# Patient Record
Sex: Male | Born: 1990 | Race: White | Hispanic: No | Marital: Single | Smoking: Never smoker
Health system: Southern US, Community
[De-identification: ages and names within clinical notes are randomized; demographics above are authoritative.]

## PROBLEM LIST (undated history)

## (undated) DIAGNOSIS — R109 Unspecified abdominal pain: Secondary | ICD-10-CM

## (undated) HISTORY — DX: Unspecified abdominal pain: R10.9

---
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
## 2008-12-12 ENCOUNTER — Ambulatory Visit (HOSPITAL_COMMUNITY): Admission: RE | Admit: 2008-12-12 | Discharge: 2008-12-12 | Payer: Self-pay | Admitting: Family Medicine

## 2009-07-28 HISTORY — PX: ARTHROSCOPIC REPAIR ACL: SUR80

## 2009-07-30 ENCOUNTER — Emergency Department (HOSPITAL_COMMUNITY): Admission: EM | Admit: 2009-07-30 | Discharge: 2009-07-30 | Payer: Self-pay | Admitting: Emergency Medicine

## 2010-10-13 LAB — POCT I-STAT, CHEM 8
BUN: 22 mg/dL (ref 6–23)
Chloride: 106 mEq/L (ref 96–112)
HCT: 50 % (ref 39.0–52.0)
Potassium: 4.2 mEq/L (ref 3.5–5.1)
Sodium: 138 mEq/L (ref 135–145)

## 2012-06-04 ENCOUNTER — Emergency Department (HOSPITAL_COMMUNITY)
Admission: EM | Admit: 2012-06-04 | Discharge: 2012-06-05 | Disposition: A | Discharge status: Home / Self Care | Payer: BC Managed Care – PPO | Attending: Emergency Medicine | Admitting: Emergency Medicine

## 2012-06-04 ENCOUNTER — Encounter (HOSPITAL_COMMUNITY): Payer: Self-pay

## 2012-06-04 DIAGNOSIS — W219XXA Striking against or struck by unspecified sports equipment, initial encounter: Secondary | ICD-10-CM | POA: Insufficient documentation

## 2012-06-04 DIAGNOSIS — Y929 Unspecified place or not applicable: Secondary | ICD-10-CM | POA: Insufficient documentation

## 2012-06-04 DIAGNOSIS — S01409A Unspecified open wound of unspecified cheek and temporomandibular area, initial encounter: Secondary | ICD-10-CM | POA: Insufficient documentation

## 2012-06-04 DIAGNOSIS — Y9367 Activity, basketball: Secondary | ICD-10-CM | POA: Insufficient documentation

## 2012-06-04 DIAGNOSIS — IMO0002 Reserved for concepts with insufficient information to code with codable children: Secondary | ICD-10-CM

## 2012-06-04 NOTE — ED Notes (Signed)
"  elbowed" by another player during basketball, small laceration noted to right cheekbone just below eye

## 2012-06-05 MED ORDER — IBUPROFEN 800 MG PO TABS: 800.0000 mg | ORAL_TABLET | Freq: Three times a day (TID) | ORAL | Status: DC

## 2012-06-05 MED ORDER — TETANUS-DIPHTH-ACELL PERTUSSIS 5-2.5-18.5 LF-MCG/0.5 IM SUSP
0.5000 mL | Freq: Once | INTRAMUSCULAR | Status: AC
  Administered 2012-06-05: 0.5 mL via INTRAMUSCULAR
  Filled 2012-06-05: qty 0.5

## 2012-06-05 NOTE — ED Provider Notes (Signed)
History     CSN: [id number]  Arrival date & time 06/04/12  2333   First MD Initiated Contact with Patient 06/04/12 2350      Chief Complaint  Patient presents with  . Facial Laceration    (Consider location/radiation/quality/duration/timing/severity/associated sxs/prior treatment) HPI Hx per PT. Playing basketball and around 10pm caught elbow to R face and sustained lac under R eye, he bandaged it, finished the game and now presents here. No LOC or neck pain, no double vision or change in vision. Bleeding controled PTA. Mild pain, sharp in quality now better. Last tetanus unk. No fall, no other pain/ injury/ trauma.  History reviewed. No pertinent past medical history.  Past Surgical History  Procedure Date  . Arthroscopic repair acl 2011    right knee    History reviewed. No pertinent family history.  History  Substance Use Topics  . Smoking status: Never Smoker   . Smokeless tobacco: Never Used  . Alcohol Use: 0.5 oz/week    1 drink(s) per week      Review of Systems  Constitutional: Negative for fever and fatigue.  HENT: Negative for nosebleeds and neck pain.   Eyes: Negative for photophobia, pain, redness and visual disturbance.  Gastrointestinal: Negative for vomiting.  Musculoskeletal: Negative for back pain.  All other systems reviewed and are negative.    Allergies  Review of patient's allergies indicates no known allergies.  Home Medications  No current outpatient prescriptions on file.  There were no vitals taken for this visit.  Physical Exam  Constitutional: He is oriented to person, place, and time. He appears well-developed and well-nourished.  HENT:  Head: Normocephalic.       2cm lac infraorbital full thickness, curved linear, hemostatic, no underlying bony tenderness, no entrapment with EOMI, vision intact bilat. No epistaxis.   Eyes: EOM are normal. Pupils are equal, round, and reactive to light.  Neck: Neck supple.       No midline  cervical tenderness or deformity  Cardiovascular: Regular rhythm and intact distal pulses.   Pulmonary/Chest: Effort normal. No respiratory distress.  Musculoskeletal: Normal range of motion. He exhibits no edema.  Neurological: He is alert and oriented to person, place, and time.  Skin: Skin is warm and dry.    ED Course  LACERATION REPAIR Date/Time: 06/05/2012 1:02 AM Performed by: Sunnie Nielsen Authorized by: Sunnie Nielsen Consent: Verbal consent obtained. Risks and benefits: risks, benefits and alternatives were discussed Patient understanding: patient states understanding of the procedure being performed Patient consent: the patient's understanding of the procedure matches consent given Procedure consent: procedure consent matches procedure scheduled Required items: required blood products, implants, devices, and special equipment available Patient identity confirmed: verbally with patient Time out: Immediately prior to procedure a "time out" was called to verify the correct patient, procedure, equipment, support staff and site/side marked as required. Body area: head/neck (R infraorbital) Laceration length: 2 cm Tendon involvement: none Nerve involvement: none Vascular damage: no Preparation: Patient was prepped and draped in the usual sterile fashion. Irrigation solution: saline Irrigation method: syringe Amount of cleaning: extensive Skin closure: glue Technique: simple Approximation: close Patient tolerance: Patient tolerated the procedure well with no immediate complications.   (including critical care time)  Tetanus updated. Wound irrigated and wound edges approximated well - decision made to glue with adequate closure achieved.   infx precautions and wound care instructions verbalized as understood   MDM   R infraorbital lac with wound closure, no indication for CT brain with  presentation as above. VS and nursing notes reviewed.         Sunnie Nielsen,  MD 06/05/12 [phone]

## 2015-10-05 ENCOUNTER — Other Ambulatory Visit (HOSPITAL_COMMUNITY): Payer: Self-pay | Admitting: Family Medicine

## 2015-10-05 DIAGNOSIS — R1084 Generalized abdominal pain: Secondary | ICD-10-CM

## 2015-10-10 ENCOUNTER — Ambulatory Visit (HOSPITAL_COMMUNITY)
Admission: RE | Admit: 2015-10-10 | Discharge: 2015-10-10 | Disposition: A | Discharge status: Home / Self Care | Payer: BLUE CROSS/BLUE SHIELD | Source: Ambulatory Visit | Attending: Family Medicine | Admitting: Family Medicine

## 2015-10-10 DIAGNOSIS — R1084 Generalized abdominal pain: Secondary | ICD-10-CM | POA: Diagnosis present

## 2015-10-23 ENCOUNTER — Encounter (INDEPENDENT_AMBULATORY_CARE_PROVIDER_SITE_OTHER): Payer: Self-pay | Admitting: *Deleted

## 2015-11-13 ENCOUNTER — Encounter (INDEPENDENT_AMBULATORY_CARE_PROVIDER_SITE_OTHER): Payer: Self-pay | Admitting: *Deleted

## 2015-11-13 ENCOUNTER — Other Ambulatory Visit (INDEPENDENT_AMBULATORY_CARE_PROVIDER_SITE_OTHER): Payer: Self-pay | Admitting: Internal Medicine

## 2015-11-13 ENCOUNTER — Encounter (INDEPENDENT_AMBULATORY_CARE_PROVIDER_SITE_OTHER): Payer: Self-pay | Admitting: Internal Medicine

## 2015-11-13 ENCOUNTER — Ambulatory Visit (INDEPENDENT_AMBULATORY_CARE_PROVIDER_SITE_OTHER): Payer: BLUE CROSS/BLUE SHIELD | Admitting: Internal Medicine

## 2015-11-13 VITALS — BP 112/60 | HR 72 | Temp 98.0°F | Ht 71.0 in | Wt 188.4 lb

## 2015-11-13 DIAGNOSIS — R1013 Epigastric pain: Secondary | ICD-10-CM

## 2015-11-13 DIAGNOSIS — R1030 Lower abdominal pain, unspecified: Secondary | ICD-10-CM | POA: Diagnosis not present

## 2015-11-13 DIAGNOSIS — R109 Unspecified abdominal pain: Secondary | ICD-10-CM | POA: Insufficient documentation

## 2015-11-13 NOTE — Patient Instructions (Addendum)
EGD. The risks and benefits such as perforation, bleeding, and infection were reviewed with the patient and is agreeable. Samples of Prilosec given to patient.

## 2015-11-13 NOTE — Progress Notes (Signed)
   Subjective:    Patient ID: Glenn Wall, male    DOB: 1991/04/12, 25 y.o.   MRN: [id number]  HPI Referred by Dr Phillips OdorGolding for abdominal pain. He tells me since January, he has had mid abdominal pain. He says everything he eats , he will feel bloated. Sometimes the pain will wake him up from sleep.  The pain occurs at random.  He denies any heart burn.  His appetite is good. No weight loss.  He usually has a BM 1-2 a day. No melena or BRRB.  He is taking a Probiotic  He tried Prilosec which he says ? Helped.   10/10/2015 US abdomen: abdominal pain x 1 month CBD 2.1 mm. . Negative abdominal US.  10/02/2015 H and H 16.0 and 46.5,BUN 22, Creat. 0.84, ALP 56, AST 19, AALT 16.   Review of Systems Past Medical History  Diagnosis Date  . Abdominal pain     Past Surgical History  Procedure Laterality Date  . Arthroscopic repair acl  2011    right knee    No Known Allergies  No current outpatient prescriptions on file prior to visit.   No current facility-administered medications on file prior to visit.        Objective:   Physical Exam Blood pressure 112/60, pulse 72, temperature 98 F (36.7 C), height 5\' 11"  (1.803 m), weight 188 lb 6.4 oz (85.458 kg). Alert and oriented. Skin warm and dry. Oral mucosa is moist.   . Sclera anicteric, conjunctivae is pink. Thyroid not enlarged. No cervical lymphadenopathy. Lungs clear. Heart regular rate and rhythm.  Abdomen is soft. Bowel sounds are positive. No hepatomegaly. No abdominal masses felt. Some epigastric tenderness. No edema to lower extremities.         Assessment & Plan:  Abdominal pain ? Etiology. PUD needs to be ruled out. EGD. The risks and benefits such as perforation, bleeding, and infection were reviewed with the patient and is agreeable. Samples of Prilosec given to patient.

## 2015-11-15 ENCOUNTER — Encounter (INDEPENDENT_AMBULATORY_CARE_PROVIDER_SITE_OTHER): Payer: Self-pay

## 2015-12-05 ENCOUNTER — Ambulatory Visit (HOSPITAL_COMMUNITY)
Admission: RE | Admit: 2015-12-05 | Discharge: 2015-12-05 | Disposition: A | Discharge status: Home / Self Care | Payer: BLUE CROSS/BLUE SHIELD | Source: Ambulatory Visit | Attending: Internal Medicine | Admitting: Internal Medicine

## 2015-12-05 ENCOUNTER — Other Ambulatory Visit (INDEPENDENT_AMBULATORY_CARE_PROVIDER_SITE_OTHER): Payer: Self-pay | Admitting: Internal Medicine

## 2015-12-05 ENCOUNTER — Encounter (HOSPITAL_COMMUNITY): Payer: Self-pay

## 2015-12-05 ENCOUNTER — Encounter (HOSPITAL_COMMUNITY)
Admission: RE | Disposition: A | Discharge status: Home / Self Care | Payer: Self-pay | Source: Ambulatory Visit | Attending: Internal Medicine

## 2015-12-05 DIAGNOSIS — Z79899 Other long term (current) drug therapy: Secondary | ICD-10-CM | POA: Insufficient documentation

## 2015-12-05 DIAGNOSIS — R1033 Periumbilical pain: Secondary | ICD-10-CM | POA: Insufficient documentation

## 2015-12-05 DIAGNOSIS — R1013 Epigastric pain: Secondary | ICD-10-CM

## 2015-12-05 DIAGNOSIS — G8929 Other chronic pain: Secondary | ICD-10-CM

## 2015-12-05 DIAGNOSIS — Z8379 Family history of other diseases of the digestive system: Secondary | ICD-10-CM | POA: Diagnosis not present

## 2015-12-05 DIAGNOSIS — R140 Abdominal distension (gaseous): Secondary | ICD-10-CM | POA: Diagnosis not present

## 2015-12-05 HISTORY — PX: ESOPHAGOGASTRODUODENOSCOPY: SHX5428

## 2015-12-05 LAB — BASIC METABOLIC PANEL
Anion gap: 6 (ref 5–15)
BUN: 21 mg/dL — ABNORMAL HIGH (ref 6–20)
CHLORIDE: 103 mmol/L (ref 101–111)
CO2: 28 mmol/L (ref 22–32)
CREATININE: 0.98 mg/dL (ref 0.61–1.24)
Calcium: 9.1 mg/dL (ref 8.9–10.3)
GFR calc Af Amer: >60 mL/min (ref >60)
GFR calc non Af Amer: >60 mL/min (ref >60)
GLUCOSE: 89 mg/dL (ref 65–99)
POTASSIUM: 3.7 mmol/L (ref 3.5–5.1)
Sodium: 137 mmol/L (ref 135–145)

## 2015-12-05 SURGERY — EGD (ESOPHAGOGASTRODUODENOSCOPY)
Anesthesia: Moderate Sedation

## 2015-12-05 MED ORDER — DICYCLOMINE HCL 10 MG PO CAPS: 10.0000 mg | ORAL_CAPSULE | Freq: Two times a day (BID) | ORAL | Status: DC

## 2015-12-05 MED ORDER — MEPERIDINE HCL 50 MG/ML IJ SOLN
INTRAMUSCULAR | Status: DC | PRN
  Administered 2015-12-05 (×2): 25 mg via INTRAVENOUS

## 2015-12-05 MED ORDER — BUTAMBEN-TETRACAINE-BENZOCAINE 2-2-14 % EX AERO
INHALATION_SPRAY | CUTANEOUS | Status: DC | PRN
  Administered 2015-12-05: 2 via TOPICAL

## 2015-12-05 MED ORDER — MEPERIDINE HCL 50 MG/ML IJ SOLN
INTRAMUSCULAR | Status: AC
  Filled 2015-12-05: qty 1

## 2015-12-05 MED ORDER — SODIUM CHLORIDE 0.9 % IV SOLN
INTRAVENOUS | Status: DC
  Administered 2015-12-05: 07:00:00 via INTRAVENOUS

## 2015-12-05 MED ORDER — MIDAZOLAM HCL 5 MG/5ML IJ SOLN
INTRAMUSCULAR | Status: AC
  Filled 2015-12-05: qty 10

## 2015-12-05 MED ORDER — STERILE WATER FOR IRRIGATION IR SOLN
Status: DC | PRN
  Administered 2015-12-05: 08:00:00

## 2015-12-05 MED ORDER — MIDAZOLAM HCL 5 MG/5ML IJ SOLN
INTRAMUSCULAR | Status: DC | PRN
  Administered 2015-12-05: 3 mg via INTRAVENOUS
  Administered 2015-12-05 (×2): 2 mg via INTRAVENOUS
  Administered 2015-12-05: 3 mg via INTRAVENOUS

## 2015-12-05 NOTE — Discharge Instructions (Signed)
Discontinue omeprazole. Dicyclomine 10 mg by mouth 15-30 minutes before breakfast and evening meal daily. Resume usual diet. No driving for 24 hours. Will schedule abdominopelvic CT. Office will call.  Gastrointestinal Endoscopy, Care After Refer to this sheet in the next few weeks. These instructions provide you with information on caring for yourself after your procedure. Your caregiver may also give you more specific instructions. Your treatment has been planned according to current medical practices, but problems sometimes occur. Call your caregiver if you have any problems or questions after your procedure. HOME CARE INSTRUCTIONS  If you were given medicine to help you relax (sedative), do not drive, operate machinery, or sign important documents for 24 hours.  Avoid alcohol and hot or warm beverages for the first 24 hours after the procedure.  Only take over-the-counter or prescription medicines for pain, discomfort, or fever as directed by your caregiver. You may resume taking your normal medicines unless your caregiver tells you otherwise. Ask your caregiver when you may resume taking medicines that may cause bleeding, such as aspirin, clopidogrel, or warfarin.  You may return to your normal diet and activities on the day after your procedure, or as directed by your caregiver. Walking may help to reduce any bloated feeling in your abdomen.  Drink enough fluids to keep your urine clear or pale yellow.  You may gargle with salt water if you have a sore throat. SEEK IMMEDIATE MEDICAL CARE IF:  You have severe nausea or vomiting.  You have severe abdominal pain, abdominal cramps that last longer than 6 hours, or abdominal swelling (distention).  You have severe shoulder or back pain.  You have trouble swallowing.  You have shortness of breath, your breathing is shallow, or you are breathing faster than normal.  You have a fever or a rapid heartbeat.  You vomit blood or material  that looks like coffee grounds.  You have bloody, black, or tarry stools. MAKE SURE YOU:  Understand these instructions.  Will watch your condition.  Will get help right away if you are not doing well or get worse.   This information is not intended to replace advice given to you by your health care provider. Make sure you discuss any questions you have with your health care provider.   Document Released: 02/26/2004 Document Revised: 08/04/2014 Document Reviewed: 10/14/2011 Elsevier Interactive Patient Education [employer]2016 Elsevier Inc.

## 2015-12-05 NOTE — Op Note (Signed)
[facility] Patient Name: Glenn Wall Procedure Date: 12/05/2015 7:32 AM MRN: [id number] Date of Birth: 11-Jul-1991 Attending MD: Lionel December , MD CSN: [id number] Age: 25 Admit Type: Outpatient Procedure:                Upper GI endoscopy Indications:              Epigastric abdominal pain, Periumbilical abdominal                            pain and postprandial bloating. Providers:                Lionel December, MD, Nena Polio, RN, Calton Dach,                            Technician Referring MD:             Corrie Mckusick Medicines:                Cetacaine spray, Meperidine 50 mg IV, Midazolam 10                            mg IV Complications:            No immediate complications. Estimated Blood Loss:     Estimated blood loss: none. Procedure:                Pre-Anesthesia Assessment:                           - Prior to the procedure, a History and Physical                            was performed, and patient medications and                            allergies were reviewed. The patient's tolerance of                            previous anesthesia was also reviewed. The risks                            and benefits of the procedure and the sedation                            options and risks were discussed with the patient.                            All questions were answered, and informed consent                            was obtained. Prior Anticoagulants: The patient has                            taken no previous anticoagulant or antiplatelet  agents. ASA Grade Assessment: I - A normal, healthy                            patient. After reviewing the risks and benefits,                            the patient was deemed in satisfactory condition to                            undergo the procedure.                           After obtaining informed consent, the endoscope was                            passed under direct vision.  Throughout the                            procedure, the patient's blood pressure, pulse, and                            oxygen saturations were monitored continuously. The                            EG-[id number] ([id number]) scope was introduced through the                            mouth, and advanced to the second part of duodenum.                            The upper GI endoscopy was accomplished without                            difficulty. The patient tolerated the procedure                            well. Scope In: 7:50:29 AM Scope Out: 7:54:55 AM Total Procedure Duration: 0 hours 4 minutes 26 seconds  Findings:      The examined esophagus was normal.      The Z-line was regular and was found 40 cm from the incisors.      The entire examined stomach was normal.      The duodenal bulb and second portion of the duodenum were normal.      The hypopharynx was normal. Impression:               - Normal esophagus.                           - Z-line regular, 40 cm from the incisors.                           - Normal stomach.                           - Normal duodenal bulb  and second portion of the                            duodenum.                           - Normal hypopharynx.                           - No specimens collected. Moderate Sedation:      Moderate (conscious) sedation was administered by the endoscopy nurse       and supervised by the endoscopist. The following parameters were       monitored: oxygen saturation, heart rate, blood pressure, CO2       capnography and response to care. Total physician intraservice time was       12 minutes. Recommendation:           - Patient has a contact number available for                            emergencies. The signs and symptoms of potential                            delayed complications were discussed with the                            patient. Return to normal activities tomorrow.                            Written  discharge instructions were provided to the                            patient.                           - Resume previous diet today.                           - Discontinue PPIs today.                           - Use Bentyl (dicyclomine) 10 mg PO BID 30 min AC                            today.                           - Perform CT scan (computed tomography) of the                            abdomen with contrast at appointment to be                            scheduled.                           - Metabolic 7 to be checked today. Procedure Code(s):        ---  Professional ---                           [id number], Esophagogastroduodenoscopy, flexible,                            transoral; diagnostic, including collection of                            specimen(s) by brushing or washing, when performed                            (separate procedure)                           99152, Moderate sedation services provided by the                            same physician or other qualified health care                            professional performing the diagnostic or                            therapeutic service that the sedation supports,                            requiring the presence of an independent trained                            observer to assist in the monitoring of the                            patient's level of consciousness and physiological                            status; initial 15 minutes of intraservice time,                            patient age 31 years or older Diagnosis Code(s):        --- Professional ---                           R10.13, Epigastric pain                           R10.33, Periumbilical pain CPT copyright 2016 American Medical Association. All rights reserved. The codes documented in this report are preliminary and upon coder review may  be revised to meet current compliance requirements. Lionel December, MD Lionel December, MD 12/05/2015 8:05:55  AM This report has been signed electronically. Number of Addenda: 0

## 2015-12-05 NOTE — H&P (Signed)
Glenn Wall is an 25 y.o. male.   Chief Complaint: Patient is here for EGD. HPI: Patient is 25 year old Caucasian male presents with 3-4 month history of fatty umbilical abdominal pain postprandial bloating he also has had pain wake him at night. He took Prilosec which may have helped. Ultrasound was negative for cholelithiasis. Blood work was normal other than borderline serum calcium and BUN of 22(upper limit of normal 20). He denies diarrhea melena or rectal bleeding. He has good appetite and has not lost any weight. He does not take NSAIDs. Does not smoke cigarettes. He drinks alcohol socially but not every day. He was begun on Prilosec for the time of office visit and he feels it may be helping. Family history significant for peptic ulcer disease in his mother and father has health problems as well. Family history is negative for celiac disease or IBD.  Past Medical History  Diagnosis Date  . Abdominal pain     Past Surgical History  Procedure Laterality Date  . Arthroscopic repair acl  2011    right knee    History reviewed. No pertinent family history. Social History:  reports that he has never smoked. He has never used smokeless tobacco. He reports that he drinks about 0.5 oz of alcohol per week. He reports that he does not use illicit drugs.  Allergies: No Known Allergies  Medications Prior to Admission  Medication Sig Dispense Refill  . omeprazole (PRILOSEC) 10 MG capsule Take 10 mg by mouth daily.      No results found for this or any previous visit (from the past 48 hour(s)). No results found.  ROS  Blood pressure 115/72, pulse 66, temperature 98.6 F (37 C), temperature source Oral, resp. rate 16, height 5\' 11"  (1.803 m), weight 183 lb (83.008 kg), SpO2 96 %. Physical Exam  Constitutional: He appears well-developed and well-nourished.  HENT:  Mouth/Throat: Oropharynx is clear and moist.  Eyes: Conjunctivae are normal. No scleral icterus.  Neck: No thyromegaly  present.  Cardiovascular: Normal rate, regular rhythm and normal heart sounds.   No murmur heard. Respiratory: Effort normal and breath sounds normal.  GI: Soft. He exhibits no distension and no mass. There is no tenderness.  Musculoskeletal: He exhibits no edema.  Lymphadenopathy:    He has no cervical adenopathy.  Neurological: He is alert.  Skin: Skin is warm and dry.     Assessment/Plan Recurrent. Umbilicus pain with postprandial bloating negative ultrasound. Diagnostic EGD.  Malissa HippoEHMAN,Leith Hedlund U, MD 12/05/2015, 7:36 AM

## 2015-12-06 ENCOUNTER — Encounter (HOSPITAL_COMMUNITY): Payer: Self-pay | Admitting: Internal Medicine

## 2015-12-10 ENCOUNTER — Ambulatory Visit (HOSPITAL_COMMUNITY)
Admission: RE | Admit: 2015-12-10 | Discharge: 2015-12-10 | Disposition: A | Discharge status: Home / Self Care | Payer: BLUE CROSS/BLUE SHIELD | Source: Ambulatory Visit | Attending: Internal Medicine | Admitting: Internal Medicine

## 2015-12-10 DIAGNOSIS — R1033 Periumbilical pain: Secondary | ICD-10-CM

## 2015-12-10 DIAGNOSIS — G8929 Other chronic pain: Secondary | ICD-10-CM

## 2015-12-10 DIAGNOSIS — K769 Liver disease, unspecified: Secondary | ICD-10-CM | POA: Diagnosis not present

## 2015-12-10 DIAGNOSIS — R1013 Epigastric pain: Secondary | ICD-10-CM | POA: Diagnosis not present

## 2015-12-10 MED ORDER — IOPAMIDOL (ISOVUE-300) INJECTION 61%
100.0000 mL | Freq: Once | INTRAVENOUS | Status: AC | PRN
  Administered 2015-12-10: 100 mL via INTRAVENOUS

## 2015-12-13 ENCOUNTER — Telehealth (INDEPENDENT_AMBULATORY_CARE_PROVIDER_SITE_OTHER): Payer: Self-pay | Admitting: Internal Medicine

## 2015-12-13 NOTE — Telephone Encounter (Signed)
Patient called, left a message stating that he had a CT done on Monday and would like his results.  [phone]

## 2015-12-13 NOTE — Telephone Encounter (Signed)
Results reviewed with patient

## 2015-12-13 NOTE — Telephone Encounter (Signed)
Forwarded to Dr.Rehman for review. 

## 2015-12-18 ENCOUNTER — Encounter (INDEPENDENT_AMBULATORY_CARE_PROVIDER_SITE_OTHER): Payer: Self-pay | Admitting: *Deleted

## 2016-03-18 ENCOUNTER — Ambulatory Visit (INDEPENDENT_AMBULATORY_CARE_PROVIDER_SITE_OTHER): Payer: BLUE CROSS/BLUE SHIELD | Admitting: Internal Medicine

## 2016-04-02 ENCOUNTER — Ambulatory Visit (INDEPENDENT_AMBULATORY_CARE_PROVIDER_SITE_OTHER): Payer: BLUE CROSS/BLUE SHIELD | Admitting: Sports Medicine

## 2016-04-02 ENCOUNTER — Encounter: Payer: Self-pay | Admitting: Sports Medicine

## 2016-04-02 DIAGNOSIS — M25561 Pain in right knee: Secondary | ICD-10-CM | POA: Diagnosis not present

## 2016-04-02 NOTE — Progress Notes (Signed)
  [name] - 25 y.o. male MRN [id number]  Date of birth: 27-Mar-1991  SUBJECTIVE:  Including CC & ROS.  Chief Complaint  Patient presents with  . Knee Pain     [name] is a 25 year old M presenting with right knee pain. This pain started about 2 months ago. He notices the pain behind the kneecap. It is worse with a squat or any time has 20 of extension. He has no pain while running but notices the pain afterwards. He also has pain at the end of the day. He does not notice any significant swelling. His knee has not done any locking or buckling. He has not taken any medications for this pain has not had any formal physical therapy. He feels the pain going up stairs and is sharp in nature. He has a history of an anterior cruciate ligament repair of the right knee from his hamstring.   HISTORY: Past Medical, Surgical, Social, and Family History Reviewed & Updated per EMR.   Pertinent Historical Findings include: PMSHx -  Right ACL repair at 25 yo  PSHx -  No tobacco use, works as Advertising account plannernsurance agent.  FHx -  Father with history of arthritis   DATA REVIEWED: None to review   PHYSICAL EXAM:  VS: BP:118/78  HR: bpm  TEMP: ( )  RESP:   HT:5\' 11"  (180.3 cm)   WT:180 lb (81.6 kg)  BMI:25.2 PHYSICAL EXAM: Gen: NAD, alert, cooperative with exam, well-appearing HEENT: clear conjunctiva, EOMI CV:  no edema, capillary refill brisk,  Resp: non-labored, normal speech Skin: no rashes, normal turgor  Neuro: no gross deficits.  Psych:  alert and oriented Knee: Visible atrophy of the right VMO compared to the left Palpation normal with no warmth, joint line tenderness, patellar tenderness, or condyle tenderness. ROM full in flexion and extension and lower leg rotation. Ligaments with solid consistent endpoints including ACL, PCL, LCL, MCL. Negative Mcmurray's, Apley's, and Thessalonian tests. Non painful patellar compression. Patellar glide without crepitus. Patellar and quadriceps tendons  unremarkable. Hamstring and quadriceps strength is normal.   Limited ultrasound: Right knee: Mild effusion in the suprapatellar pouch. Quadricep and patellar tendon are normal. Medial and lateral meniscus are intact. Trochlear groove with normal appearance.  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 He has atrophy of the right VMO compared to the left which contributing. - He was provided with strengthening exercises - He will also try a patellar strap versus a body helix patella strap. - He will follow-up in 4 weeks. If there is no improvement may need to pursue formal physical therapy.

## 2016-04-03 DIAGNOSIS — M25561 Pain in right knee: Secondary | ICD-10-CM | POA: Insufficient documentation

## 2016-04-03 NOTE — Assessment & Plan Note (Signed)
Pain is most likely patellofemoral in nature. He has atrophy of the right VMO compared to the left which contributing. - He was provided with strengthening exercises - He will also try a patellar strap versus a body helix patella strap. - He will follow-up in 4 weeks. If there is no improvement may need to pursue formal physical therapy.

## 2016-05-14 ENCOUNTER — Encounter: Payer: Self-pay | Admitting: Sports Medicine

## 2016-05-14 ENCOUNTER — Ambulatory Visit
Admission: RE | Admit: 2016-05-14 | Discharge: 2016-05-14 | Disposition: A | Discharge status: Home / Self Care | Payer: BLUE CROSS/BLUE SHIELD | Source: Ambulatory Visit | Attending: Sports Medicine | Admitting: Sports Medicine

## 2016-05-14 ENCOUNTER — Other Ambulatory Visit: Payer: Self-pay | Admitting: Sports Medicine

## 2016-05-14 ENCOUNTER — Ambulatory Visit (INDEPENDENT_AMBULATORY_CARE_PROVIDER_SITE_OTHER): Payer: BLUE CROSS/BLUE SHIELD | Admitting: Sports Medicine

## 2016-05-14 VITALS — BP 124/83 | HR 72 | Ht 71.0 in | Wt 175.0 lb

## 2016-05-14 DIAGNOSIS — M25561 Pain in right knee: Secondary | ICD-10-CM

## 2016-05-14 NOTE — Assessment & Plan Note (Signed)
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 - Referral to physical therapy - Right knee x-ray, AP, lateral, sunrise - He will follow-up in 6 weeks. If there is no improvement may need to consider an injection.

## 2016-05-14 NOTE — Progress Notes (Signed)
  Paula Libralan J Schreifels - 25 y.o. male MRN [id number]  Date of birth: 1990/12/20  SUBJECTIVE:  Including CC & ROS.  No chief complaint on file.   Mr. Rod CanBrower is a 25 yo M that is following up for right knee pain. It is thought that he had patellofemoral syndrome when he was last seen. He has been doing exercises and has had improvement in his strength was still has pain. He has pain with any form of squatting. He is able to extend his knee completely and the grinding sensation has gotten better. He still has pain with going downstairs. He denies any buckling, giving way, numbness, or tingling.  HISTORY: Past Medical, Surgical, Social, and Family History Reviewed & Updated per EMR.   Pertinent Historical Findings include: PMSHx -  Right ACL repair at 25 yo   DATA REVIEWED: Previous ultrasounds.   PHYSICAL EXAM:  VS: BP:124/83  HR:72bpm  TEMP: ( )  RESP:   HT:5\' 11"  (180.3 cm)   WT:175 lb (79.4 kg)  BMI:24.5 PHYSICAL EXAM: Gen: NAD, alert, cooperative with exam, well-appearing HEENT: clear conjunctiva, EOMI CV:  no edema, capillary refill brisk,  Resp: non-labored, normal speech Skin: no rashes, normal turgor  Neuro: no gross deficits.  Psych:  alert and oriented Right knee:  No obvious effusion. No tenderness to palpation of the medial lateral joint line. Normal clot of patellar tendon. Negative McMurray's test. No pain to stress testing with valgus or varus. No pain with patellar grind. Reproduction of pain when performing a squat.   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 - Referral to physical therapy - Right knee x-ray, AP, lateral, sunrise - He will follow-up in 6 weeks. If there is no improvement may need to consider an injection.

## 2016-05-23 ENCOUNTER — Ambulatory Visit: Payer: BLUE CROSS/BLUE SHIELD | Attending: Sports Medicine | Admitting: Physical Therapy

## 2016-05-23 ENCOUNTER — Encounter: Payer: Self-pay | Admitting: Physical Therapy

## 2016-05-23 DIAGNOSIS — G8929 Other chronic pain: Secondary | ICD-10-CM | POA: Insufficient documentation

## 2016-05-23 DIAGNOSIS — M25561 Pain in right knee: Secondary | ICD-10-CM | POA: Insufficient documentation

## 2016-05-23 NOTE — Therapy (Signed)
[facility] [address] [place], [place], [address] Phone: [phone]   Fax:  [phone]  Physical Therapy Evaluation  Patient Details  Name: Glenn Libralan J Kille MRN: [id number] Date of Birth: 30-Aug-1990 Referring Provider: Enid BaasKarl Fields, MD  Encounter Date: 05/23/2016      PT End of Session - 05/23/16 1205    Visit Number 1   Number of Visits 9   Date for PT Re-Evaluation 06/29/16   Authorization Type BCBS 30 visit limit   PT Start Time 1019   PT Stop Time 1059   PT Time Calculation (min) 40 min   Activity Tolerance Patient tolerated treatment well   Behavior During Therapy [facility] for tasks assessed/performed      Past Medical History:  Diagnosis Date  . Abdominal pain     Past Surgical History:  Procedure Laterality Date  . ARTHROSCOPIC REPAIR ACL  2011   right knee  . ESOPHAGOGASTRODUODENOSCOPY N/A 12/05/2015   Procedure: ESOPHAGOGASTRODUODENOSCOPY (EGD);  Surgeon: Malissa HippoNajeeb U Rehman, MD;  [facility];  Service: Endoscopy;  Laterality: N/A;  2:05 - moved to 5/19 @ 2:40 - Ann notified pt    There were no vitals filed for this visit.       Subjective Assessment - 05/23/16 1021    Subjective 6 years ago ACL repair. approx 4 months ago. Not painful when walking, slightly when running. Has pain with leg extension, squatting is uncomfortable, unable to do single leg squat.    Diagnostic tests xrays unremarkable   Patient Stated Goals return to exercise, decrease pain.    Currently in Pain? No/denies  2/10 at worst            [facility] PT Assessment - 05/23/16 0001      Assessment   Medical Diagnosis R patellofemoral syndrome   Referring Provider Enid BaasKarl Fields, MD   Hand Dominance Right   Next MD Visit nov 29   Prior Therapy no     Precautions   Precautions None     Restrictions   Weight Bearing Restrictions No     Balance Screen   Has the patient fallen in the past 6 months No     Home Environment    Living Environment Private residence   Additional Comments no     Prior Function   Level of Independence Independent   Vocation Full time employment   Vocation Requirements sell insurance     Cognition   Overall Cognitive Status Within Functional Limits for tasks assessed     Observation/Other Assessments   Focus on Therapeutic Outcomes (FOTO)  57% ability     Posture/Postural Control   Posture Comments valgus collapse in single leg stance andsquat.      ROM / Strength   AROM / PROM / Strength Strength     Strength   Strength Assessment Site Hip   Right/Left Hip Right;Left   Right Hip Flexion 4+/5  illiopsoas 4-/5   Right Hip Extension 5/5   Right Hip ABduction 5/5   Left Hip Flexion 4+/5  illiopsoas 4-/5   Left Hip Extension 5/5   Left Hip ABduction 5/5     Palpation   Patella mobility limited sup/inf on R.                   [name] - 05/23/16 0001      Exercises   Exercises Knee/Hip     Knee/Hip Exercises: Stretches   Passive Hamstring Stretch  Limitations seated EOB   Quad Stretch Limitations standing quad/hip flexor stretch   Piriformis Stretch Limitations figure 4     Knee/Hip Exercises: Standing   Functional Squat Limitations cuing for proper alignemnt     Manual Therapy   Manual Therapy Taping   McConnell lateral patellar glide                PT Education - 05/23/16 1205    Education provided Yes   Education Details anatomy of condition, POC, HEP, exercise form/rationale, importance of stretching, LE alignment in squats   Person(s) Educated Patient   Methods Explanation;Demonstration;Tactile cues;Verbal cues;Handout   Comprehension Verbalized understanding;Returned demonstration;Verbal cues required;Tactile cues required;Need further instruction          PT Short Term Goals - 05/23/16 1210      PT SHORT TERM GOAL #1   Title Pt will be able to descend stairs without knee pain by 12/3   Baseline  moderate pain at eval   Time 4   Period Weeks   Status New     PT SHORT TERM GOAL #2   Title Pt will be able to return to independent exercise program without limitation by knee pain   Baseline has decreased exercise due to pain   Time 4   Period Weeks   Status New     PT SHORT TERM GOAL #3   Title FOTO to 76% ability to indicate significant increase in fucntional ability   Baseline 57% ability at eval   Time 4   Period Weeks   Status New     PT SHORT TERM GOAL #4   Title Pt will be able to demonstrate an appropraite squat to and from a chair without knee pain    Baseline moderate pain at eval   Time 4   Period Weeks   Status New                  Plan - 05/23/16 1206    Clinical Impression Statement Pt presents to PT with complaints of R knee pain that bothers him mostly with squatting and descending stairs. H/O ACL repair 6 years ago. Pt has good strength with exception of hip flexion and isolation of illiopsoas which are weak. pt demo IR of R lower extremity in SLS and valgus collapse in squatting, pain significantly decreased with application of taping for lateral patellar glide. Pt will benefit from skilled PT to improve awareness of biomechanical chain contraction to improve patellar tracking and decrease pain.    Rehab Potential Good   PT Frequency 2x / week   PT Duration 4 weeks   PT Treatment/Interventions ADLs/Self Care Home Management;Cryotherapy;Electrical Stimulation;Iontophoresis 4mg /ml Dexamethasone;Functional mobility training;Stair training;Gait training;Ultrasound;Traction;Moist Heat;Therapeutic activities;Therapeutic exercise;Balance training;Neuromuscular re-education;Patient/family education;Passive range of motion;Manual techniques;Dry needling;Taping   PT Next Visit Plan mcconnel tape R lateral patellar glide, stretch, illiopsoas activation   PT Home Exercise Plan hamstring and quad stretch, figure 4 piriformis stretch   Consulted and Agree with  Plan of Care Patient      Patient will benefit from skilled therapeutic intervention in order to improve the following deficits and impairments:  Pain, Improper body mechanics, Impaired flexibility, Decreased strength, Decreased activity tolerance  Visit Diagnosis: Chronic pain of right knee - Plan: PT plan of care cert/re-cert     Problem List Patient Active Problem List   Diagnosis Date Noted  . Right knee pain 04/03/2016  . Abdominal pain 11/13/2015    Ludger Bones C. Onix Jumper PT, [name] 05/23/16  12:14 PM   [facility] [address] [place], [place], [address] Phone: [phone]   Fax:  [phone]  Name: Glenn Wall MRN: [id number] Date of Birth: 1990-10-21

## 2016-05-27 ENCOUNTER — Ambulatory Visit: Payer: BLUE CROSS/BLUE SHIELD | Admitting: Physical Therapy

## 2016-05-27 DIAGNOSIS — M25561 Pain in right knee: Secondary | ICD-10-CM | POA: Diagnosis not present

## 2016-05-27 DIAGNOSIS — G8929 Other chronic pain: Secondary | ICD-10-CM

## 2016-05-27 NOTE — Therapy (Signed)
[facility] [address] [place], [place], [address] Phone: [phone]   Fax:  [phone]  Physical Therapy Treatment  Patient Details  Name: Glenn Wall MRN: [id number] Date of Birth: Jul 04, 1991 Referring Provider: Enid BaasKarl Fields, MD  Encounter Date: 05/27/2016      PT End of Session - 05/27/16 1422    Visit Number 2   Number of Visits 9   Date for PT Re-Evaluation 06/29/16   Authorization Type BCBS 30 visit limit   PT Start Time 0218   PT Stop Time 0308   PT Time Calculation (min) 50 min      Past Medical History:  Diagnosis Date  . Abdominal pain     Past Surgical History:  Procedure Laterality Date  . ARTHROSCOPIC REPAIR ACL  2011   right knee  . ESOPHAGOGASTRODUODENOSCOPY N/A 12/05/2015   Procedure: ESOPHAGOGASTRODUODENOSCOPY (EGD);  Surgeon: Malissa HippoNajeeb U Rehman, MD;  [facility];  Service: Endoscopy;  Laterality: N/A;  2:05 - moved to 5/19 @ 2:40 - Ann notified pt    There were no vitals filed for this visit.                       [name] - 05/27/16 0001      Knee/Hip Exercises: Stretches   Passive Hamstring Stretch Limitations seated EOB   Quad Stretch Limitations standing quad/hip flexor stretch   Piriformis Stretch Limitations figure 4     Knee/Hip Exercises: Standing   Forward Step Up 5 reps   Functional Squat Limitations cuing for proper alignemnt     Knee/Hip Exercises: Supine   Bridges Limitations x 10 with DF, the x 19 single leg   Straight Leg Raise with External Rotation 1 set;15 reps     Modalities   Modalities Cryotherapy     Cryotherapy   Number Minutes Cryotherapy 10 Minutes   Cryotherapy Location Knee   Type of Cryotherapy Ice pack     Manual Therapy   Manual Therapy Taping   McConnell lateral patellar glide                PT Education - 05/27/16 1510    Education provided Yes   Education Details hep, name of  mcconnel tape   Person(s) Educated Patient   Methods Explanation;Handout   Comprehension Verbalized understanding          PT Short Term Goals - 05/23/16 1210      PT SHORT TERM GOAL #1   Title Pt will be able to descend stairs without knee pain by 12/3   Baseline moderate pain at eval   Time 4   Period Weeks   Status New     PT SHORT TERM GOAL #2   Title Pt will be able to return to independent exercise program without limitation by knee pain   Baseline has decreased exercise due to pain   Time 4   Period Weeks   Status New     PT SHORT TERM GOAL #3   Title FOTO to 76% ability to indicate significant increase in fucntional ability   Baseline 57% ability at eval   Time 4   Period Weeks   Status New     PT SHORT TERM GOAL #4   Title Pt will be able to demonstrate an appropraite squat to and from a chair without knee pain    Baseline moderate pain at eval   Time 4  Period Weeks   Status New                  Plan - 05/27/16 1510    Clinical Impression Statement Review of stretches, patient is independent. Added SLR with abduction and external rotation. Pt quicky fatigues. Added to HEP. Pt reports tape lateral pulled tape helpful while donned however he had to remove it for stretches. He may purchas his own tape. Squats with tape donned more comfortable however continued pain with eccentric step with tape donned.    PT Next Visit Plan mcconnel tape R lateral patellar glide, stretch, review illiopsoas activation   PT Home Exercise Plan hamstring and quad stretch, figure 4 piriformis stretch, SLR with ER    Consulted and Agree with Plan of Care Patient      Patient will benefit from skilled therapeutic intervention in order to improve the following deficits and impairments:  Pain, Improper body mechanics, Impaired flexibility, Decreased strength, Decreased activity tolerance  Visit Diagnosis: Chronic pain of right knee     Problem List Patient Active  Problem List   Diagnosis Date Noted  . Right knee pain 04/03/2016  . Abdominal pain 11/13/2015    Sherrie Mustacheonoho, Aretta Stetzel McGee, PTA 05/27/2016, 3:31 PM  [facility] [address] [place], [place], [address] Phone: [phone]   Fax:  [phone]  Name: Glenn Wall MRN: [id number] Date of Birth: Jul 21, 1991

## 2016-05-27 NOTE — Patient Instructions (Signed)
Straight Leg Raise: With External Leg Rotation    Lie on back with right leg straight, opposite leg bent. Rotate straight leg out and lift __12__ inches. Repeat __10-20__ times per set. Do __1-2__ sets per session. Do __2__ sessions per day.

## 2016-05-29 ENCOUNTER — Ambulatory Visit: Payer: BLUE CROSS/BLUE SHIELD | Attending: Sports Medicine | Admitting: Physical Therapy

## 2016-05-29 ENCOUNTER — Encounter: Payer: Self-pay | Admitting: Physical Therapy

## 2016-05-29 DIAGNOSIS — M25561 Pain in right knee: Secondary | ICD-10-CM | POA: Insufficient documentation

## 2016-05-29 DIAGNOSIS — G8929 Other chronic pain: Secondary | ICD-10-CM

## 2016-05-29 NOTE — Therapy (Signed)
[facility] [address] [place], [place], [address] Phone: [phone]   Fax:  [phone]  Physical Therapy Treatment  Patient Details  Name: Glenn Wall MRN: [id number] Date of Birth: [birth year] Referring Provider: Enid BaasKarl Fields, MD  Encounter Date: 05/29/2016      PT End of Session - 05/29/16 1013    Visit Number 3   Number of Visits 9   Date for PT Re-Evaluation 06/29/16   Authorization Type BCBS 30 visit limit   PT Start Time 1015   PT Stop Time 1106   PT Time Calculation (min) 51 min   Activity Tolerance Patient tolerated treatment well   Behavior During Therapy [facility] for tasks assessed/performed      Past Medical History:  Diagnosis Date  . Abdominal pain     Past Surgical History:  Procedure Laterality Date  . ARTHROSCOPIC REPAIR ACL  2011   right knee  . ESOPHAGOGASTRODUODENOSCOPY N/A 12/05/2015   Procedure: ESOPHAGOGASTRODUODENOSCOPY (EGD);  Surgeon: Malissa HippoNajeeb U Rehman, MD;  [facility];  Service: Endoscopy;  Laterality: N/A;  2:05 - moved to 5/19 @ 2:40 - Ann notified pt    There were no vitals filed for this visit.      Subjective Assessment - 05/29/16 1013    Subjective hamstrings are a little sore but nothing abnormal. last tape application did not provide a change.    Currently in Pain? No/denies                         [facility] Treatment/Exercise - 05/29/16 0001      Knee/Hip Exercises: Stretches   Passive Hamstring Stretch Limitations supine with strap   Quad Stretch Limitations standing quad/hip flexor stretch   Piriformis Stretch Limitations figure 4     Knee/Hip Exercises: Machines for Strengthening   Cybex Leg Press cues for form   Astronomerther Machine reformer: see note     Cryotherapy   Number Minutes Cryotherapy 10 Minutes   Cryotherapy Location Knee  R   Type of Cryotherapy Ice pack     Manual Therapy   McConnell lateral patellar glide       Reformer: 2 red 1 green Foot work- press through heels, press through [employer] CompanyPF Static bridging in DF sidlelying hip abd foot in strap 1 red sidelying leg sweep 1 red sidelying knee flex, ext 1 red Short box qped 1 red   Neutral & ER               PT Short Term Goals - 05/23/16 1210      PT SHORT TERM GOAL #1   Title Pt will be able to descend stairs without knee pain by 12/3   Baseline moderate pain at eval   Time 4   Period Weeks   Status New     PT SHORT TERM GOAL #2   Title Pt will be able to return to independent exercise program without limitation by knee pain   Baseline has decreased exercise due to pain   Time 4   Period Weeks   Status New     PT SHORT TERM GOAL #3   Title FOTO to 76% ability to indicate significant increase in fucntional ability   Baseline 57% ability at eval   Time 4   Period Weeks   Status New     PT SHORT TERM GOAL #4   Title Pt will be able to demonstrate an appropraite  squat to and from a chair without knee pain    Baseline moderate pain at eval   Time 4   Period Weeks   Status New                  Plan - 05/29/16 1110    Clinical Impression Statement Utilized reformer for Raytheonweight bearing strength challenges to core and lower extremity biomechanical chain. Significant difficulty and fatigue noted, required rest breaks; indicating further need for functional strengthening to provide support to knee joint.    PT Next Visit Plan reformer   PT Home Exercise Plan hamstring and quad stretch, figure 4 piriformis stretch, SLR with ER, leg press at gym    Consulted and Agree with Plan of Care Patient      Patient will benefit from skilled therapeutic intervention in order to improve the following deficits and impairments:     Visit Diagnosis: Chronic pain of right knee     Problem List Patient Active Problem List   Diagnosis Date Noted  . Right knee pain 04/03/2016  . Abdominal pain 11/13/2015    Itzamara Casas C.  Ameen Mostafa PT, [name] 05/29/16 11:53 AM   [facility] [address] [place], [place], [address] Phone: [phone]   Fax:  [phone]  Name: Glenn Wall MRN: [id number] Date of Birth: 01-08-1991

## 2016-06-03 ENCOUNTER — Ambulatory Visit: Payer: BLUE CROSS/BLUE SHIELD | Admitting: Physical Therapy

## 2016-06-03 DIAGNOSIS — M25561 Pain in right knee: Principal | ICD-10-CM

## 2016-06-03 DIAGNOSIS — G8929 Other chronic pain: Secondary | ICD-10-CM | POA: Diagnosis not present

## 2016-06-03 NOTE — Therapy (Signed)
[facility] [address] [place], [place], [address] Phone: [phone]   Fax:  [phone]  Physical Therapy Treatment  Patient Details  Name: Glenn Wall MRN: [id number] Date of Birth: 1991/05/16 Referring Provider: Enid BaasKarl Fields, MD  Encounter Date: 06/03/2016      PT End of Session - 06/03/16 1421    Visit Number 4   Number of Visits 9   Date for PT Re-Evaluation 06/29/16   Authorization Type BCBS 30 visit limit   PT Start Time 0215   PT Stop Time 0310   PT Time Calculation (min) 55 min      Past Medical History:  Diagnosis Date  . Abdominal pain     Past Surgical History:  Procedure Laterality Date  . ARTHROSCOPIC REPAIR ACL  2011   right knee  . ESOPHAGOGASTRODUODENOSCOPY N/A 12/05/2015   Procedure: ESOPHAGOGASTRODUODENOSCOPY (EGD);  Surgeon: Malissa HippoNajeeb U Rehman, MD;  [facility];  Service: Endoscopy;  Laterality: N/A;  2:05 - moved to 5/19 @ 2:40 - Ann notified pt    There were no vitals filed for this visit.      Subjective Assessment - 06/03/16 1420    Subjective No pain unless I do something, like stairs   Currently in Pain? No/denies                         [facility] Treatment/Exercise - 06/03/16 0001      Knee/Hip Exercises: Stretches   Passive Hamstring Stretch Limitations supine with strap   Quad Stretch Limitations standing quad/hip flexor stretch   Piriformis Stretch Limitations figure 4     Knee/Hip Exercises: Agricultural engineerMachines for Strengthening   Other Machine Reformer Foot work: single and bilateral 2 red on heels on toes, ER,-ball between knees and ankles for alignment., Heel raises 2 red with ball between ankles ; sidelying 1 red, hip abduction, knee flex and ext, sweep x 8 each bilateral, bridge with all springs ball between knees-difficulty maintaining DF     Cryotherapy   Number Minutes Cryotherapy 10 Minutes   Cryotherapy Location Knee   Type of Cryotherapy Ice  pack     Manual Therapy   McConnell lateral patellar glide                  PT Short Term Goals - 05/23/16 1210      PT SHORT TERM GOAL #1   Title Pt will be able to descend stairs without knee pain by 12/3   Baseline moderate pain at eval   Time 4   Period Weeks   Status New     PT SHORT TERM GOAL #2   Title Pt will be able to return to independent exercise program without limitation by knee pain   Baseline has decreased exercise due to pain   Time 4   Period Weeks   Status New     PT SHORT TERM GOAL #3   Title FOTO to 76% ability to indicate significant increase in fucntional ability   Baseline 57% ability at eval   Time 4   Period Weeks   Status New     PT SHORT TERM GOAL #4   Title Pt will be able to demonstrate an appropraite squat to and from a chair without knee pain    Baseline moderate pain at eval   Time 4   Period Weeks   Status New  Plan - 06/03/16 1550    Clinical Impression Statement Continued Reformer for Weight bearing strength challenges to core and lower extremity biomechanical  chain. Pt rpeorts soreness after previous session and demonstrates fatigue today especially in anterior tib and gluteals. Pt with difficulty maintaining neutral ankles in reformer and tends to supinate. Moderate cues required for alignment and use of great toes/ball of foot.    PT Next Visit Plan reformer, tape, stretch   PT Home Exercise Plan hamstring and quad stretch, figure 4 piriformis stretch, SLR with ER, leg press at gym       Patient will benefit from skilled therapeutic intervention in order to improve the following deficits and impairments:  Pain, Improper body mechanics, Impaired flexibility, Decreased strength, Decreased activity tolerance  Visit Diagnosis: Chronic pain of right knee     Problem List Patient Active Problem List   Diagnosis Date Noted  . Right knee pain 04/03/2016  . Abdominal pain 11/13/2015     Sherrie Mustacheonoho, Vondell Sowell McGee, PTA 06/03/2016, 3:53 PM  [facility] [address] [place], [place], [address] Phone: [phone]   Fax:  [phone]  Name: Glenn Wall MRN: [id number] Date of Birth: [birth year]

## 2016-06-05 ENCOUNTER — Encounter: Payer: Self-pay | Admitting: Physical Therapy

## 2016-06-05 ENCOUNTER — Ambulatory Visit: Payer: BLUE CROSS/BLUE SHIELD | Admitting: Physical Therapy

## 2016-06-05 DIAGNOSIS — M25561 Pain in right knee: Principal | ICD-10-CM

## 2016-06-05 DIAGNOSIS — G8929 Other chronic pain: Secondary | ICD-10-CM | POA: Diagnosis not present

## 2016-06-05 NOTE — Therapy (Signed)
[facility] [place], [place], [address] Phone: [phone]   Fax:  [phone]  Physical Therapy Treatment  Patient Details  Name: Glenn Wall MRN: [id number] Date of Birth: Nov 28, 1990 Referring Provider: Enid BaasKarl Fields, MD  Encounter Date: 06/05/2016      PT End of Session - 06/05/16 1332    Visit Number 5   Number of Visits 9   Date for PT Re-Evaluation 06/29/16   Authorization Type BCBS 30 visit limit   PT Start Time 1332   PT Stop Time 1429   PT Time Calculation (min) 57 min   Activity Tolerance Patient tolerated treatment well   Behavior During Therapy [facility] for tasks assessed/performed      Past Medical History:  Diagnosis Date  . Abdominal pain     Past Surgical History:  Procedure Laterality Date  . ARTHROSCOPIC REPAIR ACL  2011   right knee  . ESOPHAGOGASTRODUODENOSCOPY N/A 12/05/2015   Procedure: ESOPHAGOGASTRODUODENOSCOPY (EGD);  Surgeon: Malissa HippoNajeeb U Rehman, MD;  [facility];  Service: Endoscopy;  Laterality: N/A;  2:05 - moved to 5/19 @ 2:40 - Ann notified pt    There were no vitals filed for this visit.      Subjective Assessment - 06/05/16 1333    Subjective Did not really notice anything when doing stairs yesterday.    Currently in Pain? No/denies                         [facility] PT Treatment/Exercise - 06/05/16 0001      Knee/Hip Exercises: Stretches   Piriformis Stretch Limitations figure 4   Gastroc Stretch 2 reps;30 seconds     Knee/Hip Exercises: Machines for Curatortrengthening   Other Machine reformer-see note     Knee/Hip Exercises: Supine   Single Leg Bridge 20 reps;Both  cues for DF   Other Supine Knee/Hip Exercises sidelying hip flx to 90/90, ext 2#     Knee/Hip Exercises: Sidelying   Other Sidelying Knee/Hip Exercises long axis hip flexion 2#     Knee/Hip Exercises: Prone   Straight Leg Raises Limitations --     Cryotherapy   Number  Minutes Cryotherapy 10 Minutes   Cryotherapy Location Knee   Type of Cryotherapy Ice pack      Reformer:   Platform press bilat LE 2red, 1 green Platform squat pulses 3x30 2 red 1 green  1 red single leg sidelying press from platform 3x10         PT Education - 06/05/16 1334    Education provided Yes   Education Details exercise form/rationale, reformer, HEP, biomechanical chain reactions with small changes, body awareness when exercising   Person(s) Educated Patient   Methods Explanation;Demonstration;Tactile cues;Verbal cues   Comprehension Verbalized understanding;Returned demonstration;Verbal cues required;Tactile cues required;Need further instruction          PT Short Term Goals - 05/23/16 1210      PT SHORT TERM GOAL #1   Title Pt will be able to descend stairs without knee pain by 12/3   Baseline moderate pain at eval   Time 4   Period Weeks   Status New     PT SHORT TERM GOAL #2   Title Pt will be able to return to independent exercise program without limitation by knee pain   Baseline has decreased exercise due to pain   Time 4   Period Weeks   Status New  PT SHORT TERM GOAL #3   Title FOTO to 76% ability to indicate significant increase in fucntional ability   Baseline 57% ability at eval   Time 4   Period Weeks   Status New     PT SHORT TERM GOAL #4   Title Pt will be able to demonstrate an appropraite squat to and from a chair without knee pain    Baseline moderate pain at eval   Time 4   Period Weeks   Status New                  Plan - 06/05/16 1429    Clinical Impression Statement improved control of ankle inversion but required cuing to maintain DF in a neural movement. Increased difficulty utilizing glut med when performing exercises correctly. Minimal improvement in knee grinding in CKC squat indicating further need for muscular strengthening for support.     PT Next Visit Plan reformer, LE biomech alignment   Consulted  and Agree with Plan of Care Patient      Patient will benefit from skilled therapeutic intervention in order to improve the following deficits and impairments:     Visit Diagnosis: Chronic pain of right knee     Problem List Patient Active Problem List   Diagnosis Date Noted  . Right knee pain 04/03/2016  . Abdominal pain 11/13/2015    Alysiah Suppa C. Shuna Tabor PT, [name] 06/05/16 3:47 PM   [facility] [address] [place], [place], [address] Phone: [phone]   Fax:  [phone]  Name: Glenn Wall MRN: [id number] Date of Birth: 09-11-1990

## 2016-06-10 ENCOUNTER — Ambulatory Visit: Payer: BLUE CROSS/BLUE SHIELD | Admitting: Physical Therapy

## 2016-06-10 DIAGNOSIS — G8929 Other chronic pain: Secondary | ICD-10-CM

## 2016-06-10 DIAGNOSIS — M25561 Pain in right knee: Principal | ICD-10-CM

## 2016-06-10 NOTE — Therapy (Signed)
[facility] [address] [place], [place], [address] Phone: [phone]   Fax:  [phone]  Physical Therapy Treatment  Patient Details  Name: Glenn Wall MRN: [id number] Date of Birth: Apr 17, 1991 Referring Provider: Enid BaasKarl Fields, MD  Encounter Date: 06/10/2016      PT End of Session - 06/10/16 1416    Visit Number 6   Number of Visits 9   Date for PT Re-Evaluation 06/29/16   Authorization Type BCBS 30 visit limit   PT Start Time 0208   PT Stop Time 0309   PT Time Calculation (min) 61 min      Past Medical History:  Diagnosis Date  . Abdominal pain     Past Surgical History:  Procedure Laterality Date  . ARTHROSCOPIC REPAIR ACL  2011   right knee  . ESOPHAGOGASTRODUODENOSCOPY N/A 12/05/2015   Procedure: ESOPHAGOGASTRODUODENOSCOPY (EGD);  Surgeon: Malissa HippoNajeeb U Rehman, MD;  [facility];  Service: Endoscopy;  Laterality: N/A;  2:05 - moved to 5/19 @ 2:40 - Ann notified pt    There were no vitals filed for this visit.                       [name] - 06/10/16 0001      Knee/Hip Exercises: Aerobic   Recumbent Bike L4 x 5 minutes     Knee/Hip Exercises: Machines for Product managertrengthening   Other Machine Reformer- single and bilateral knee foot work on heel and toe focusing on slow eccentrics      Knee/Hip Exercises: Standing   SLS cone taps at counter with slight knee flexion, 30 sec x2      Knee/Hip Exercises: Seated   Long Arc Quad 5 reps   Long Arc Quad Weight 5 lbs.   Long Arc Quad Limitations holding @ 45 degrees    Sit to [employer] CorporationSand 10 reps  green band around thighs      Knee/Hip Exercises: Supine   Bridges Limitations Marching during bridge with green band around knees    Single Leg Bridge 20 reps;Both  cues for DF   Straight Leg Raise with External Rotation 1 set;20 reps   Straight Leg Raise with External Rotation Limitations 2#   Other Supine Knee/Hip  Exercises sidelying hip flx to 90/90, ext 2#     Knee/Hip Exercises: Sidelying   Other Sidelying Knee/Hip Exercises long axis hip flexion no wt     Cryotherapy   Cryotherapy Location Knee   Type of Cryotherapy Ice pack                  PT Short Term Goals - 05/23/16 1210      PT SHORT TERM GOAL #1   Title Pt will be able to descend stairs without knee pain by 12/3   Baseline moderate pain at eval   Time 4   Period Weeks   Status New     PT SHORT TERM GOAL #2   Title Pt will be able to return to independent exercise program without limitation by knee pain   Baseline has decreased exercise due to pain   Time 4   Period Weeks   Status New     PT SHORT TERM GOAL #3   Title FOTO to 76% ability to indicate significant increase in fucntional ability   Baseline 57% ability at eval   Time 4   Period Weeks   Status New     PT  SHORT TERM GOAL #4   Title Pt will be able to demonstrate an appropraite squat to and from a chair without knee pain    Baseline moderate pain at eval   Time 4   Period Weeks   Status New                  Plan - 06/10/16 1519    Clinical Impression Statement Pt did not perform HEP this weekend due to being out of town. He went hiking without difficulty. Sit-stands are less painful with intermittent grinding. Closed chain quad exercises continue to increased pain. He was able to perform cone drill in SLS with slight knee flexion without increased pain.    PT Next Visit Plan reformer, LE biomech alignment      Patient will benefit from skilled therapeutic intervention in order to improve the following deficits and impairments:  Pain, Improper body mechanics, Impaired flexibility, Decreased strength, Decreased activity tolerance  Visit Diagnosis: Chronic pain of right knee     Problem List Patient Active Problem List   Diagnosis Date Noted  . Right knee pain 04/03/2016  . Abdominal pain 11/13/2015    Sherrie Mustacheonoho, Braedyn Riggle McGee ,  PTA 06/10/2016, 3:26 PM  [facility] [address] [place], [place], [address] Phone: [phone]   Fax:  [phone]  Name: Glenn Wall MRN: [id number] Date of Birth: 10-27-1990

## 2016-06-12 ENCOUNTER — Encounter: Payer: Self-pay | Admitting: Physical Therapy

## 2016-06-12 ENCOUNTER — Ambulatory Visit: Payer: BLUE CROSS/BLUE SHIELD | Admitting: Physical Therapy

## 2016-06-12 DIAGNOSIS — G8929 Other chronic pain: Secondary | ICD-10-CM

## 2016-06-12 DIAGNOSIS — M25561 Pain in right knee: Principal | ICD-10-CM

## 2016-06-12 NOTE — Therapy (Signed)
[facility] [address] [place], [place], [address] Phone: [phone]   Fax:  [phone]  Physical Therapy Treatment  Patient Details  Name: Glenn Wall MRN: [id number] Date of Birth: 10-04-1990 Referring Provider: Enid BaasKarl Fields, MD  Encounter Date: 06/12/2016      PT End of Session - 06/12/16 1334    Visit Number 7   Number of Visits 9   Date for PT Re-Evaluation 06/29/16   Authorization Type BCBS 30 visit limit   PT Start Time 1332   PT Stop Time 1425   PT Time Calculation (min) 53 min   Activity Tolerance Patient tolerated treatment well   Behavior During Therapy [facility] for tasks assessed/performed      Past Medical History:  Diagnosis Date  . Abdominal pain     Past Surgical History:  Procedure Laterality Date  . ARTHROSCOPIC REPAIR ACL  2011   right knee  . ESOPHAGOGASTRODUODENOSCOPY N/A 12/05/2015   Procedure: ESOPHAGOGASTRODUODENOSCOPY (EGD);  Surgeon: Malissa HippoNajeeb U Rehman, MD;  [facility];  Service: Endoscopy;  Laterality: N/A;  2:05 - moved to 5/19 @ 2:40 - Ann notified pt    There were no vitals filed for this visit.      Subjective Assessment - 06/12/16 1333    Subjective Some improvement, slowly but surely.    Currently in Pain? No/denies            [facility] - 06/12/16 0001      Strength   Right Hip Flexion 4+/5  illiopsoas 4/5   Left Hip Flexion 4+/5  illiopsoas 4/5                     [name] - 06/12/16 0001      Knee/Hip Exercises: Stretches   Passive Hamstring Stretch Limitations seated EOB   Quad Stretch Limitations standing   Piriformis Stretch Limitations figure 4     Knee/Hip Exercises: Aerobic   Elliptical 5 min L1     Knee/Hip Exercises: Standing   Hip Flexion Limitations SLR with pulses at top     Knee/Hip Exercises: Seated   Other Seated Knee/Hip Exercises primal push ups with knee pull outs, ball bw ankles;  qped hip hinge     Knee/Hip Exercises: Prone   Straight Leg Raises Limitations qped turnout slide leg fwd     Cryotherapy   Number Minutes Cryotherapy 10 Minutes   Cryotherapy Location Knee  R   Type of Cryotherapy Ice pack                PT Education - 06/12/16 1425    Education provided Yes   Education Details exercise form/rationale, HEP   Person(s) Educated Patient   Methods Explanation;Demonstration;Tactile cues;Verbal cues;Handout   Comprehension Verbalized understanding;Returned demonstration;Verbal cues required;Tactile cues required;Need further instruction          PT Short Term Goals - 05/23/16 1210      PT SHORT TERM GOAL #1   Title Pt will be able to descend stairs without knee pain by 12/3   Baseline moderate pain at eval   Time 4   Period Weeks   Status New     PT SHORT TERM GOAL #2   Title Pt will be able to return to independent exercise program without limitation by knee pain   Baseline has decreased exercise due to pain   Time 4   Period Weeks   Status New  PT SHORT TERM GOAL #3   Title FOTO to 76% ability to indicate significant increase in fucntional ability   Baseline 57% ability at eval   Time 4   Period Weeks   Status New     PT SHORT TERM GOAL #4   Title Pt will be able to demonstrate an appropraite squat to and from a chair without knee pain    Baseline moderate pain at eval   Time 4   Period Weeks   Status New                  Plan - 06/12/16 1426    Clinical Impression Statement Significant fatigue and difficulty with exercises today. Able to complete squat without cavitations with PT applying R to L pressure on hip and external rotation pressure at knee joint.    PT Next Visit Plan hip ER strength and control in CKC activities. try placing foot into eversion/arch support during squat   PT Home Exercise Plan hamstring and quad stretch, figure 4 piriformis stretch, SLR with ER, leg press at gym; qped hip hinge,  leg sweep, primal push up, SLR with pulse lifts   Consulted and Agree with Plan of Care Patient      Patient will benefit from skilled therapeutic intervention in order to improve the following deficits and impairments:     Visit Diagnosis: Chronic pain of right knee     Problem List Patient Active Problem List   Diagnosis Date Noted  . Right knee pain 04/03/2016  . Abdominal pain 11/13/2015    Donnisha Besecker C. Jalilah Wiltsie PT, [name] 06/12/16 2:36 PM   [facility] [address] [place], [place], [address] Phone: [phone]   Fax:  [phone]  Name: Glenn Wall MRN: [id number] Date of Birth: July 03, 1991

## 2016-06-17 ENCOUNTER — Ambulatory Visit: Payer: BLUE CROSS/BLUE SHIELD | Admitting: Physical Therapy

## 2016-06-17 DIAGNOSIS — M25561 Pain in right knee: Principal | ICD-10-CM

## 2016-06-17 DIAGNOSIS — G8929 Other chronic pain: Secondary | ICD-10-CM | POA: Diagnosis not present

## 2016-06-17 NOTE — Therapy (Signed)
[place], [place], [address] Phone: [phone]   Fax:  [phone]  Physical Therapy Treatment  Patient Details  Name: Glenn Wall MRN: [id number] Date of Birth: October 11, 1990 Referring Provider: Stefanie Libel, MD  Encounter Date: 06/17/2016      PT End of Session - 06/17/16 1422    Visit Number 8   Number of Visits 9   Date for PT Re-Evaluation 06/29/16   Authorization Type BCBS 30 visit limit   PT Start Time 0215   PT Stop Time 0315   PT Time Calculation (min) 60 min      Past Medical History:  Diagnosis Date  . Abdominal pain     Past Surgical History:  Procedure Laterality Date  . ARTHROSCOPIC REPAIR ACL  2011   right knee  . ESOPHAGOGASTRODUODENOSCOPY N/A 12/05/2015   Procedure: ESOPHAGOGASTRODUODENOSCOPY (EGD);  Surgeon: Rogene Houston, MD;  [facility];  Service: Endoscopy;  Laterality: N/A;  2:05 - moved to 5/19 @ 2:40 - Ann notified pt    There were no vitals filed for this visit.      Subjective Assessment - 06/17/16 1422    Subjective Still about the same    Currently in Pain? No/denies            [facility] PT Assessment - 06/17/16 0001      Observation/Other Assessments   Focus on Therapeutic Outcomes (FOTO)  62% ability                     [name] - 06/17/16 0001      Knee/Hip Exercises: Aerobic   Recumbent Bike L4 x 5 minutes     Knee/Hip Exercises: Standing   Other Standing Knee Exercises Hip Hinge from mat gray band around thighs in external rotation, x 10 while maintaining raised arch and ER with gray band - no pain     Knee/Hip Exercises: Seated   Other Seated Knee/Hip Exercises primal push ups with knee pull outs, ball bw ankles 10 x 3 gray band around thighs ; qped hip hinge     Knee/Hip Exercises: Supine   Bridges with Clamshell 20 reps  gray band    Straight Leg Raises 1 set;20 reps   Straight Leg  Raises Limitations 2#   Straight Leg Raise with External Rotation 1 set;20 reps   Straight Leg Raise with External Rotation Limitations 2#   Other Supine Knee/Hip Exercises clam with gray band x 40      Knee/Hip Exercises: Prone   Straight Leg Raises Limitations qped turnout slide leg fwd 10 x 3 each side      Cryotherapy   Number Minutes Cryotherapy 10 Minutes   Cryotherapy Location Knee   Type of Cryotherapy Ice pack                  PT Short Term Goals - 06/17/16 1516      PT SHORT TERM GOAL #1   Title Pt will be able to descend stairs without knee pain by 12/3   Baseline still painful    Time 4   Period Weeks   Status On-going     PT SHORT TERM GOAL #2   Title Pt will be able to return to independent exercise program without limitation by knee pain   Baseline has decreased exercise due to pain   Time 4   Period Weeks   Status On-going  PT SHORT TERM GOAL #3   Title FOTO to 76% ability to indicate significant increase in fucntional ability   Baseline 62% ability   Time 4   Period Weeks   Status On-going     PT SHORT TERM GOAL #4   Title Pt will be able to demonstrate an appropraite squat to and from a chair without knee pain    Baseline min pain   Time 4   Period Weeks   Status Partially Met                  Plan - 06/17/16 1508    Clinical Impression Statement Review of HEP. Pt has only perfromed onnce over last 5 days. He reports decreased pain with sit-stand exercises. Single leg exercises on right still painful especially steps. No pain at all when adding short foot/arch lift to sit-stands while maintaining ER with gray band. Encouraged pt to increase HEP compliance to maximize results.    PT Next Visit Plan hip ER strength and control in CKC activities. continue placing foot into eversion/arch support during squat   PT Home Exercise Plan hamstring and quad stretch, figure 4 piriformis stretch, SLR with ER, leg press at gym; qped hip  hinge, leg sweep, primal push up, SLR with pulse lifts   Consulted and Agree with Plan of Care Patient      Patient will benefit from skilled therapeutic intervention in order to improve the following deficits and impairments:  Pain, Improper body mechanics, Impaired flexibility, Decreased strength, Decreased activity tolerance  Visit Diagnosis: Chronic pain of right knee     Problem List Patient Active Problem List   Diagnosis Date Noted  . Right knee pain 04/03/2016  . Abdominal pain 11/13/2015    Dorene Ar, PTA 06/17/2016, 3:18 PM  [place], [place], [address] Phone: [phone]   Fax:  [phone]  Name: TADASHI BURKEL MRN: [id number] Date of Birth: 09-Sep-1990

## 2016-06-24 ENCOUNTER — Ambulatory Visit: Payer: BLUE CROSS/BLUE SHIELD | Admitting: Physical Therapy

## 2016-06-25 ENCOUNTER — Ambulatory Visit (INDEPENDENT_AMBULATORY_CARE_PROVIDER_SITE_OTHER): Payer: BLUE CROSS/BLUE SHIELD | Admitting: Sports Medicine

## 2016-06-25 ENCOUNTER — Encounter: Payer: Self-pay | Admitting: Sports Medicine

## 2016-06-25 VITALS — BP 124/70 | HR 78 | Ht 71.0 in | Wt 175.0 lb

## 2016-06-25 DIAGNOSIS — M25561 Pain in right knee: Secondary | ICD-10-CM | POA: Diagnosis not present

## 2016-06-25 NOTE — Assessment & Plan Note (Signed)
He has been doing well since PT. He feels like he would continue to get improvement with more sessions.  - referral to PT  - F/U PRN

## 2016-06-25 NOTE — Progress Notes (Signed)
  Glenn Wall Glenn Wall - 25 y.o. male MRN [id number]  Date of birth: Jan 13, 1991  SUBJECTIVE:  Including CC & ROS.   Glenn Wall is a 25 yo M that is following up for this right knee pain. He has been going to PT and reports improvement in his function and pain. He is able to flex his knee more and there is no pain with squats. He is still unable to perform single leg squats. He has been to about eight sessions of PT and has one more left. He reports that he has improvement while he is at PT and doesn't always perform his home exercises.   HISTORY: Past Medical, Surgical, Social, and Family History Reviewed & Updated per EMR.   Pertinent Historical Findings include: PMSHx -  Right ACL repair at 25 yo   DATA REVIEWED: None today   PHYSICAL EXAM:  VS: BP:124/70  HR:78bpm  TEMP: ( )  RESP:   HT:5\' 11"  (180.3 cm)   WT:175 lb (79.4 kg)  BMI:24.5 PHYSICAL EXAM: Gen: NAD, alert, cooperative with exam, well-appearing HEENT: clear conjunctiva, EOMI CV:  no edema, capillary refill brisk,  Resp: non-labored, normal speech Skin: no rashes, normal turgor  Neuro: no gross deficits.  Psych:  alert and oriented Right Knee:  No obvious swelling.  No TTP of the medial or lateral joint line.  Normal flexion and extension.  Able to perform one leg stand and touch with each hand.  Unable to perform one leg squat against a wall. Neurovascularly intact    ASSESSMENT & PLAN:   Right knee pain He has been doing well since PT. He feels like he would continue to get improvement with more sessions.  - referral to PT  - F/U PRN

## 2016-06-26 ENCOUNTER — Ambulatory Visit: Payer: BLUE CROSS/BLUE SHIELD | Admitting: Physical Therapy

## 2016-06-26 ENCOUNTER — Encounter: Payer: Self-pay | Admitting: Physical Therapy

## 2016-06-26 DIAGNOSIS — M25561 Pain in right knee: Principal | ICD-10-CM

## 2016-06-26 DIAGNOSIS — G8929 Other chronic pain: Secondary | ICD-10-CM | POA: Diagnosis not present

## 2016-06-26 NOTE — Therapy (Signed)
[facility] [address] [place], [place], [address] Phone: [phone]   Fax:  [phone]  Physical Therapy Treatment  Patient Details  Name: Glenn Wall MRN: [id number] Date of Birth: 1990/11/09 Referring Provider: Enid BaasKarl Fields, MD  Encounter Date: 06/26/2016      PT End of Session - 06/26/16 1418    Visit Number 9   Number of Visits 17   Date for PT Re-Evaluation 07/25/16   Authorization Type BCBS 30 visit limit   PT Start Time 1417   PT Stop Time 1510   PT Time Calculation (min) 53 min   Activity Tolerance Patient tolerated treatment well   Behavior During Therapy [facility] for tasks assessed/performed      Past Medical History:  Diagnosis Date  . Abdominal pain     Past Surgical History:  Procedure Laterality Date  . ARTHROSCOPIC REPAIR ACL  2011   right knee  . ESOPHAGOGASTRODUODENOSCOPY N/A 12/05/2015   Procedure: ESOPHAGOGASTRODUODENOSCOPY (EGD);  Surgeon: Malissa HippoNajeeb U Rehman, MD;  [facility];  Service: Endoscopy;  Laterality: N/A;  2:05 - moved to 5/19 @ 2:40 - Ann notified pt    There were no vitals filed for this visit.      Subjective Assessment - 06/26/16 1418    Subjective Feels like knee is getting better slowly. Still feels some grinding. Discomfort in single leg activities. Able to do double leg squat.    Currently in Pain? No/denies            [facility] - 06/26/16 0001      Assessment   Medical Diagnosis R patellofemoral syndrome   Referring Provider Enid BaasKarl Fields, MD     Posture/Postural Control   Posture Comments valgus collapse single leg eccentric movements     Strength   Strength Assessment Site Knee   Right Hip Flexion 4+/5  illiopsoas 4/5   Left Hip Flexion 4+/5  illiopsoas 4/5   Right/Left Knee Right;Left   Right Knee Flexion 4+/5   Right Knee Extension 5/5   Left Knee Flexion 4/5   Left Knee Extension 5/5     Palpation   Patella mobility limited  inferior glide                     [name] - 06/26/16 0001      Knee/Hip Exercises: Stretches   Passive Hamstring Stretch Limitations supine with strap   Piriformis Stretch Limitations figure 4     Knee/Hip Exercises: Standing   Abduction Limitations red tband iso knee flx with hip abd+ER   Wall Squat 3 sets;10 reps  heel raises   Other Standing Knee Exercises iso squat with green tabnd UE press     Knee/Hip Exercises: Sidelying   Clams green tband with feet elevated, x15 abd x15 add     Knee/Hip Exercises: Prone   Straight Leg Raises Limitations Qped turnout and neutral press red tband     Cryotherapy   Number Minutes Cryotherapy 10 Minutes   Cryotherapy Location Knee   Type of Cryotherapy Ice pack                PT Education - 06/26/16 1419    Education provided Yes   Education Details exercise form/rationale, HEP, POC   Person(s) Educated Patient   Methods Explanation;Demonstration;Tactile cues;Verbal cues   Comprehension Verbalized understanding;Returned demonstration;Verbal cues required;Tactile cues required;Need further instruction          PT  Short Term Goals - 06/26/16 1558      PT SHORT TERM GOAL #1   Title Pt will be able to descend stairs without knee pain by 12/29   Baseline pain is decreasing but still has pain with eccentric control   Time 4   Period Weeks   Status On-going     PT SHORT TERM GOAL #2   Title Pt will be able to return to independent exercise program without limitation by knee pain   Baseline pain continues in single leg activites   Time 4   Period Weeks   Status On-going     PT SHORT TERM GOAL #3   Title FOTO to 76% ability to indicate significant increase in fucntional ability   Baseline unable to asses   Time 4   Period Weeks   Status On-going     PT SHORT TERM GOAL #4   Title Pt will be able to demonstrate an appropraite squat to and from a chair without knee pain     Baseline able with use of bilateral LE   Status Achieved     PT SHORT TERM GOAL #5   Title Pt will demo single leg eccentric squat without pain in knee to descend stairs and return to PLOF   Baseline pain and grinding in single leg activities   Time 4   Period Weeks   Status New                  Plan - 06/26/16 1554    Clinical Impression Statement Pt demo improved control of knee and ankle joints when perofrming double leg squats but continues to demo significant valgus collapse in single leg activities indicating further need for skilled PT to improve functional control in highter level activities.    Rehab Potential Good   PT Frequency 2x / week   PT Duration 4 weeks   PT Treatment/Interventions ADLs/Self Care Home Management;Cryotherapy;Electrical Stimulation;Iontophoresis 4mg /ml Dexamethasone;Functional mobility training;Stair training;Gait training;Ultrasound;Traction;Moist Heat;Therapeutic activities;Therapeutic exercise;Balance training;Neuromuscular re-education;Patient/family education;Passive range of motion;Manual techniques;Dry needling;Taping   PT Next Visit Plan hip ER strength and control in CKC activities. reformer   PT Home Exercise Plan hamstring and quad stretch, figure 4 piriformis stretch, SLR with ER, leg press at gym; qped hip hinge, leg sweep, primal push up, SLR with pulse lifts   Consulted and Agree with Plan of Care Patient      Patient will benefit from skilled therapeutic intervention in order to improve the following deficits and impairments:  Pain, Improper body mechanics, Impaired flexibility, Decreased strength, Decreased activity tolerance  Visit Diagnosis: Chronic pain of right knee - Plan: PT plan of care cert/re-cert     Problem List Patient Active Problem List   Diagnosis Date Noted  . Right knee pain 04/03/2016  . Abdominal pain 11/13/2015    Josalynn Johndrow C. Emonte Dieujuste PT, [name] 06/26/16 4:05 PM   [facility] [address] [place], [place], [address] Phone: [phone]   Fax:  [phone]  Name: Glenn Wall MRN: [id number] Date of Birth: 06/24/1991

## 2016-07-03 ENCOUNTER — Ambulatory Visit: Payer: BLUE CROSS/BLUE SHIELD | Attending: Sports Medicine | Admitting: Physical Therapy

## 2016-07-03 ENCOUNTER — Encounter: Payer: Self-pay | Admitting: Physical Therapy

## 2016-07-03 DIAGNOSIS — G8929 Other chronic pain: Secondary | ICD-10-CM | POA: Diagnosis not present

## 2016-07-03 DIAGNOSIS — M25561 Pain in right knee: Secondary | ICD-10-CM | POA: Insufficient documentation

## 2016-07-03 NOTE — Therapy (Signed)
[facility] [address] [place], [place], [address] Phone: [phone]   Fax:  [phone]  Physical Therapy Treatment  Patient Details  Name: Glenn Wall MRN: [id number] Date of Birth: 07-Jul-1991 Referring Provider: Enid BaasKarl Fields, MD  Encounter Date: 07/03/2016      PT End of Session - 07/03/16 1418    Visit Number 10   Number of Visits 17   Date for PT Re-Evaluation 07/25/16   Authorization Type BCBS 30 visit limit   PT Start Time 1415   PT Stop Time 1506   PT Time Calculation (min) 51 min   Activity Tolerance Patient tolerated treatment well   Behavior During Therapy [facility] for tasks assessed/performed      Past Medical History:  Diagnosis Date  . Abdominal pain     Past Surgical History:  Procedure Laterality Date  . ARTHROSCOPIC REPAIR ACL  2011   right knee  . ESOPHAGOGASTRODUODENOSCOPY N/A 12/05/2015   Procedure: ESOPHAGOGASTRODUODENOSCOPY (EGD);  Surgeon: Malissa HippoNajeeb U Rehman, MD;  [facility];  Service: Endoscopy;  Laterality: N/A;  2:05 - moved to 5/19 @ 2:40 - Ann notified pt    There were no vitals filed for this visit.      Subjective Assessment - 07/03/16 1417    Subjective Denies pain today. Is a little more consistent with HEP.   Patient Stated Goals return to exercise, decrease pain.    Currently in Pain? No/denies                         [facility] Treatment/Exercise - 07/03/16 0001      Exercises   Exercises Other Exercises   Other Exercises  reformer: see note     Knee/Hip Exercises: Stretches   Piriformis Stretch Limitations figure 4   Other Knee/Hip Stretches downward dog     Knee/Hip Exercises: Aerobic   Stepper 5 min L5     Knee/Hip Exercises: Standing   Heel Raises 15 reps   Heel Raises Limitations edge of step     Knee/Hip Exercises: Seated   Other Seated Knee/Hip Exercises primal push ups ball and blue tband; qped hip hinge     Cryotherapy   Number Minutes Cryotherapy 10 Minutes   Cryotherapy Location Knee   Type of Cryotherapy Ice pack     Reformer:  Two leg jump 1 red, 1 blue Single leg press 1 red 1 blue R heel raises 1 red 1 blue Sidelying press 1 red 1 blue R leg press 1 red 1 blue           PT Education - 07/03/16 1418    Education provided Yes   Education Details exercise form/rationale   Person(s) Educated Patient   Methods Explanation;Demonstration;Tactile cues;Verbal cues   Comprehension Verbalized understanding;Returned demonstration;Verbal cues required;Tactile cues required;Need further instruction          PT Short Term Goals - 06/26/16 1558      PT SHORT TERM GOAL #1   Title Pt will be able to descend stairs without knee pain by 12/29   Baseline pain is decreasing but still has pain with eccentric control   Time 4   Period Weeks   Status On-going     PT SHORT TERM GOAL #2   Title Pt will be able to return to independent exercise program without limitation by knee pain   Baseline pain continues in single leg activites   Time 4  Period Weeks   Status On-going     PT SHORT TERM GOAL #3   Title FOTO to 76% ability to indicate significant increase in fucntional ability   Baseline unable to asses   Time 4   Period Weeks   Status On-going     PT SHORT TERM GOAL #4   Title Pt will be able to demonstrate an appropraite squat to and from a chair without knee pain    Baseline able with use of bilateral LE   Status Achieved     PT SHORT TERM GOAL #5   Title Pt will demo single leg eccentric squat without pain in knee to descend stairs and return to PLOF   Baseline pain and grinding in single leg activities   Time 4   Period Weeks   Status New                  Plan - 07/03/16 1456    Clinical Impression Statement Was able to complete single leg activites on reformer without pain but with significant difficulty engaging glut med/min for support.    PT Next Visit Plan hip ER  strength and control in CKC activities. reformer   PT Home Exercise Plan hamstring and quad stretch, figure 4 piriformis stretch, SLR with ER, leg press at gym; qped hip hinge, leg sweep, primal push up, SLR with pulse lifts   Consulted and Agree with Plan of Care Patient      Patient will benefit from skilled therapeutic intervention in order to improve the following deficits and impairments:     Visit Diagnosis: Chronic pain of right knee     Problem List Patient Active Problem List   Diagnosis Date Noted  . Right knee pain 04/03/2016  . Abdominal pain 11/13/2015   Michah Minton C. Jurline Folger PT, [name] 07/03/16 2:59 PM   [facility] [address] [place], [place], [address] Phone: [phone]   Fax:  [phone]  Name: Glenn Wall MRN: [id number] Date of Birth: 01-04-1991

## 2016-07-08 ENCOUNTER — Ambulatory Visit: Payer: BLUE CROSS/BLUE SHIELD | Admitting: Physical Therapy

## 2016-07-08 DIAGNOSIS — M25561 Pain in right knee: Secondary | ICD-10-CM | POA: Diagnosis not present

## 2016-07-08 DIAGNOSIS — G8929 Other chronic pain: Secondary | ICD-10-CM | POA: Diagnosis not present

## 2016-07-08 NOTE — Therapy (Signed)
[facility] [address] [place], [place], [address] Phone: [phone]   Fax:  [phone]  Physical Therapy Treatment  Patient Details  Name: Glenn Wall MRN: [id number] Date of Birth: Jul 27, 1991 Referring Provider: Enid BaasKarl Fields, MD  Encounter Date: 07/08/2016      PT End of Session - 07/08/16 2141    Visit Number 11   Number of Visits 17   Date for PT Re-Evaluation 07/25/16   PT Start Time 1455   PT Stop Time 1540   PT Time Calculation (min) 45 min   Activity Tolerance Patient tolerated treatment well   Behavior During Therapy [facility] for tasks assessed/performed      Past Medical History:  Diagnosis Date  . Abdominal pain     Past Surgical History:  Procedure Laterality Date  . ARTHROSCOPIC REPAIR ACL  2011   right knee  . ESOPHAGOGASTRODUODENOSCOPY N/A 12/05/2015   Procedure: ESOPHAGOGASTRODUODENOSCOPY (EGD);  Surgeon: Malissa HippoNajeeb U Rehman, MD;  [facility];  Service: Endoscopy;  Laterality: N/A;  2:05 - moved to 5/19 @ 2:40 - Ann notified pt    There were no vitals filed for this visit.      Subjective Assessment - 07/08/16 1545    Subjective NO pain today.         Pilates Reformer used for LE/core strength, postural strength, lumbopelvic disassociation and core control.  Exercises included: Footwork 4 springs, double leg in parallel , turnout, on heels, arch and forefoot  , good control.  Heel raises eccentric with cues for core, stretching gastroc post.  Bridging 3 springs with ball x 10 added press out, able to do 5 without rest break, cues to maintain LE alignment  Feet in Straps 1  red 1 yellow Arcs and squats x 10 , good hold of neutral pelvis Quadruped 1 blue UE x 10, added LE x 10 and combo x 8, min cues and guard to maintain lumbar neutral with hip ext  Scooter 1 Red followed by ant hip stretch  hamstring stretching and ITB stretch post 60 sec each LE       PT Education - 07/08/16 2141     Education Details Pilates concepts and exercises, control, alignment   Person(s) Educated Patient   Methods Explanation;Demonstration   Comprehension Verbalized understanding          PT Short Term Goals - 07/08/16 2145      PT SHORT TERM GOAL #1   Title Pt will be able to descend stairs without knee pain by 12/29   Status On-going     PT SHORT TERM GOAL #2   Title Pt will be able to return to independent exercise program without limitation by knee pain   Baseline pain continues in single leg activites with traditional gym ex   Status On-going     PT SHORT TERM GOAL #3   Title FOTO to 76% ability to indicate significant increase in fucntional ability   Status Unable to assess                  Plan - 07/08/16 2142    Clinical Impression Statement Pt did well with Reformer exercises, challenged with more functional, reciprocal LE work, tactile cues to maintain pelvic stability on Rt. side.     PT Next Visit Plan hip ER strength and control in CKC activities. reformer FOTO?   PT Home Exercise Plan hamstring and quad stretch, figure 4 piriformis stretch, SLR with ER, leg  press at gym; qped hip hinge, leg sweep, primal push up, SLR with pulse lifts   Consulted and Agree with Plan of Care Patient      Patient will benefit from skilled therapeutic intervention in order to improve the following deficits and impairments:  Pain, Improper body mechanics, Impaired flexibility, Decreased strength, Decreased activity tolerance  Visit Diagnosis: Chronic pain of right knee     Problem List Patient Active Problem List   Diagnosis Date Noted  . Right knee pain 04/03/2016  . Abdominal pain 11/13/2015    Glenn Wall 07/08/2016, 9:47 PM  [facility] [address] [place], [place], [address] Phone: [phone]   Fax:  [phone]  Name: Glenn Wall MRN: [id number] Date of Birth: Mar 23, 1991  Karie MainlandJennifer Akya Fiorello,  PT 07/08/16 9:53 PM Phone: [phone] Fax: [phone]

## 2016-07-09 ENCOUNTER — Ambulatory Visit: Payer: BLUE CROSS/BLUE SHIELD | Admitting: Physical Therapy

## 2016-07-09 ENCOUNTER — Encounter: Payer: Self-pay | Admitting: Physical Therapy

## 2016-07-09 DIAGNOSIS — G8929 Other chronic pain: Secondary | ICD-10-CM | POA: Diagnosis not present

## 2016-07-09 DIAGNOSIS — M25561 Pain in right knee: Principal | ICD-10-CM

## 2016-07-09 NOTE — Therapy (Signed)
[facility] [address] [place], [place], [address] Phone: [phone]   Fax:  [phone]  Physical Therapy Treatment  Patient Details  Name: Glenn Wall MRN: [id number] Date of Birth: 1990/12/14 Referring Provider: Enid BaasKarl Fields, MD  Encounter Date: 07/09/2016      PT End of Session - 07/09/16 1418    Visit Number 12   Number of Visits 17   Date for PT Re-Evaluation 07/25/16   PT Start Time 1416   PT Stop Time 1506   PT Time Calculation (min) 50 min   Activity Tolerance Patient tolerated treatment well   Behavior During Therapy [facility] for tasks assessed/performed      Past Medical History:  Diagnosis Date  . Abdominal pain     Past Surgical History:  Procedure Laterality Date  . ARTHROSCOPIC REPAIR ACL  2011   right knee  . ESOPHAGOGASTRODUODENOSCOPY N/A 12/05/2015   Procedure: ESOPHAGOGASTRODUODENOSCOPY (EGD);  Surgeon: Malissa HippoNajeeb U Rehman, MD;  [facility];  Service: Endoscopy;  Laterality: N/A;  2:05 - moved to 5/19 @ 2:40 - Ann notified pt    There were no vitals filed for this visit.      Subjective Assessment - 07/09/16 1418    Subjective Denies pain today. Reformer was a good challenge   Currently in Pain? No/denies                         [facility] PT Treatment/Exercise - 07/09/16 0001      Knee/Hip Exercises: Stretches   Piriformis Stretch Limitations figure 4   Other Knee/Hip Stretches downward dog     Knee/Hip Exercises: Aerobic   Stepper 5 min L5     Knee/Hip Exercises: Standing   Side Lunges Limitations monster walks green tband   Functional Squat Limitations L foot on box x15   Wall Squat 3 sets   Wall Squat Limitations 3 sets 10 pulls green tband   SLS on pad knee flx 3x1 min, mirror for alignment   Other Standing Knee Exercises hip hikes, R     Knee/Hip Exercises: Supine   Straight Leg Raises Limitations feet on physioball alt lift      Cryotherapy   Number Minutes Cryotherapy 10 Minutes   Cryotherapy Location Knee   Type of Cryotherapy Ice pack                PT Education - 07/08/16 2141    Education Details Pilates concepts and exercises, control, alignment   Person(s) Educated Patient   Methods Explanation;Demonstration   Comprehension Verbalized understanding          PT Short Term Goals - 07/08/16 2145      PT SHORT TERM GOAL #1   Title Pt will be able to descend stairs without knee pain by 12/29   Status On-going     PT SHORT TERM GOAL #2   Title Pt will be able to return to independent exercise program without limitation by knee pain   Baseline pain continues in single leg activites with traditional gym ex   Status On-going     PT SHORT TERM GOAL #3   Title FOTO to 76% ability to indicate significant increase in fucntional ability   Status Unable to assess                  Plan - 07/09/16 1501    Clinical Impression Statement Pt was able to recognize contraction of  glut med/min for suppor to knee joint after heavy VC with exercises. was able to carry support over to other exercise once it was recognized. Will cont to benefit from glut med/min training to improve proximal stability and improve movmeent at knee joint.    PT Next Visit Plan hip ER strength and control in CKC activities. reformer Conservator, museum/galleryOTO   Consulted and Agree with Plan of Care Patient      Patient will benefit from skilled therapeutic intervention in order to improve the following deficits and impairments:     Visit Diagnosis: Chronic pain of right knee     Problem List Patient Active Problem List   Diagnosis Date Noted  . Right knee pain 04/03/2016  . Abdominal pain 11/13/2015    Jamir Rone C. Eljay Lave PT, [name] 07/09/16 3:03 PM   [facility] [address] [place], [place], [address] Phone: [phone]   Fax:  [phone]  Name: Glenn Wall MRN: [id number] Date of  Birth: 05-08-1991

## 2016-07-15 ENCOUNTER — Encounter: Payer: Self-pay | Admitting: Physical Therapy

## 2016-07-15 ENCOUNTER — Ambulatory Visit: Payer: BLUE CROSS/BLUE SHIELD | Admitting: Physical Therapy

## 2016-07-15 DIAGNOSIS — G8929 Other chronic pain: Secondary | ICD-10-CM | POA: Diagnosis not present

## 2016-07-15 DIAGNOSIS — M25561 Pain in right knee: Secondary | ICD-10-CM | POA: Diagnosis not present

## 2016-07-15 NOTE — Therapy (Signed)
[facility] [address] [place], [place], [address] Phone: [phone]   Fax:  [phone]  Physical Therapy Treatment  Patient Details  Name: Glenn Libralan J Ethier MRN: [id number] Date of Birth: [birth year] Referring Provider: Enid BaasKarl Fields, MD  Encounter Date: 07/15/2016      PT End of Session - 07/15/16 1416    Visit Number 13   Number of Visits 17   Date for PT Re-Evaluation 07/25/16   Authorization Type BCBS 30 visit limit   PT Start Time 1417   PT Stop Time 1507   PT Time Calculation (min) 50 min   Activity Tolerance Patient tolerated treatment well   Behavior During Therapy [facility] for tasks assessed/performed      Past Medical History:  Diagnosis Date  . Abdominal pain     Past Surgical History:  Procedure Laterality Date  . ARTHROSCOPIC REPAIR ACL  2011   right knee  . ESOPHAGOGASTRODUODENOSCOPY N/A 12/05/2015   Procedure: ESOPHAGOGASTRODUODENOSCOPY (EGD);  Surgeon: Malissa HippoNajeeb U Rehman, MD;  [facility];  Service: Endoscopy;  Laterality: N/A;  2:05 - moved to 5/19 @ 2:40 - Ann notified pt    There were no vitals filed for this visit.      Subjective Assessment - 07/15/16 1417    Subjective Reports gluts were sore after visit but denies pain.    Currently in Pain? No/denies                         [facility] Treatment/Exercise - 07/15/16 0001      Knee/Hip Exercises: Stretches   Passive Hamstring Stretch Limitations supine with strap   Quad Stretch Limitations prone with strap   Piriformis Stretch Limitations figure 4   Gastroc Stretch 2 reps;30 seconds   Other Knee/Hip Stretches downward dog     Knee/Hip Exercises: Aerobic   Stepper 5 min L5     Knee/Hip Exercises: Standing   Side Lunges Limitations monster walks green tband   Lateral Step Up Limitations knee drives 3x25 bilat   SLS golfer lift on airex 5lb kettle bell x10 ea   Other Standing Knee Exercises wall sit, ball bw  knees, alt knee ext  too painful to continue   Other Standing Knee Exercises monster walk turnout     Cryotherapy   Number Minutes Cryotherapy 10 Minutes   Cryotherapy Location Knee   Type of Cryotherapy Ice pack                  PT Short Term Goals - 07/08/16 2145      PT SHORT TERM GOAL #1   Title Pt will be able to descend stairs without knee pain by 12/29   Status On-going     PT SHORT TERM GOAL #2   Title Pt will be able to return to independent exercise program without limitation by knee pain   Baseline pain continues in single leg activites with traditional gym ex   Status On-going     PT SHORT TERM GOAL #3   Title FOTO to 76% ability to indicate significant increase in fucntional ability   Status Unable to assess                  Plan - 07/15/16 1454    Clinical Impression Statement Significant fatigue noted today with exercises, able to recruit glut med/min. Pain noted in R knee with wall squat paired with lifting of L foot from  the floor.    PT Next Visit Plan hip ER strength and control in CKC activities. reformer Conservator, museum/galleryOTO   Consulted and Agree with Plan of Care Patient      Patient will benefit from skilled therapeutic intervention in order to improve the following deficits and impairments:     Visit Diagnosis: Chronic pain of right knee     Problem List Patient Active Problem List   Diagnosis Date Noted  . Right knee pain 04/03/2016  . Abdominal pain 11/13/2015   Kalah Pflum C. Ananias Kolander PT, [name] 07/15/16 3:01 PM   [facility] [address] [place], [place], [address] Phone: [phone]   Fax:  [phone]  Name: Glenn Libralan J Ureta MRN: [id number] Date of Birth: 12-Nov-1990

## 2016-07-17 ENCOUNTER — Encounter: Payer: Self-pay | Admitting: Physical Therapy

## 2016-07-17 ENCOUNTER — Ambulatory Visit: Payer: BLUE CROSS/BLUE SHIELD | Admitting: Physical Therapy

## 2016-07-17 DIAGNOSIS — M25561 Pain in right knee: Principal | ICD-10-CM

## 2016-07-17 DIAGNOSIS — G8929 Other chronic pain: Secondary | ICD-10-CM

## 2016-07-17 NOTE — Therapy (Signed)
[facility] [address] [place], [place], [address] Phone: [phone]   Fax:  [phone]  Physical Therapy Treatment  Patient Details  Name: Glenn Wall [place] MRN: [id number] Date of Birth: May 04, 1991 Referring Provider: Enid BaasKarl Fields, MD  Encounter Date: 07/17/2016      PT End of Session - 07/17/16 1501    Visit Number 14   Number of Visits 17   Date for PT Re-Evaluation 07/25/16   Authorization Type BCBS 30 visit limit   PT Start Time 1418   PT Stop Time 1458   PT Time Calculation (min) 40 min   Activity Tolerance Patient tolerated treatment well   Behavior During Therapy [facility] for tasks assessed/performed      Past Medical History:  Diagnosis Date  . Abdominal pain     Past Surgical History:  Procedure Laterality Date  . ARTHROSCOPIC REPAIR ACL  2011   right knee  . ESOPHAGOGASTRODUODENOSCOPY N/A 12/05/2015   Procedure: ESOPHAGOGASTRODUODENOSCOPY (EGD);  Surgeon: Malissa HippoNajeeb U Rehman, MD;  [facility];  Service: Endoscopy;  Laterality: N/A;  2:05 - moved to 5/19 @ 2:40 - Ann notified pt    There were no vitals filed for this visit.      Subjective Assessment - 07/17/16 1420    Subjective Tuesday night felt approx 4/10 pain in knee, aching. No pain the next morning.    Patient Stated Goals return to exercise, decrease pain.    Currently in Pain? No/denies            [facility] Assessment - 07/17/16 0001      Observation/Other Assessments   Focus on Therapeutic Outcomes (FOTO)  70% ability                     [name] - 07/17/16 0001      Knee/Hip Exercises: Stretches   Passive Hamstring Stretch Limitations supine with strap   Quad Stretch Limitations prone with strap   Piriformis Stretch Limitations figure 4   Other Knee/Hip Stretches downward dog     Knee/Hip Exercises: Aerobic   Stepper 5 min L5     Knee/Hip Exercises: Standing   Heel Raises  Limitations edge of step- knees ext and flx   Lateral Step Up Limitations knee drives 6" step, slow and fast   SLS golfer lift on airex 5lb kettle bell x10 ea   Other Standing Knee Exercises side stepping, green band X at feet   Other Standing Knee Exercises single leg squat with slider x10 ea                PT Education - 07/17/16 1500    Education provided Yes   Education Details exercise form/rationale, knowledge of muscle contraction   Person(s) Educated Patient   Methods Explanation;Demonstration;Tactile cues;Verbal cues;Handout   Comprehension Verbalized understanding;Returned demonstration;Verbal cues required;Tactile cues required;Need further instruction          PT Short Term Goals - 07/08/16 2145      PT SHORT TERM GOAL #1   Title Pt will be able to descend stairs without knee pain by 12/29   Status On-going     PT SHORT TERM GOAL #2   Title Pt will be able to return to independent exercise program without limitation by knee pain   Baseline pain continues in single leg activites with traditional gym ex   Status On-going     PT SHORT TERM GOAL #3   Title  FOTO to 76% ability to indicate significant increase in fucntional ability   Status Unable to assess                  Plan - 07/17/16 1502    Clinical Impression Statement Increased dynamic challenge for activation of gluts in functional activities such as squatting and stair climbing. minimal grinding was not painful in single leg squat during slider exercise.    PT Next Visit Plan reformer challenges, LE alignment in single leg and squatting activities   Consulted and Agree with Plan of Care Patient      Patient will benefit from skilled therapeutic intervention in order to improve the following deficits and impairments:     Visit Diagnosis: Chronic pain of right knee     Problem List Patient Active Problem List   Diagnosis Date Noted  . Right knee pain 04/03/2016  . Abdominal pain  11/13/2015    Glenn Wall PT, [name] 07/17/16 3:10 PM   [facility] [address] [place], [place], [address] Phone: [phone]   Fax:  [phone]  Name: Glenn Wall MRN: [id number] Date of Birth: 1991-04-27

## 2016-07-22 ENCOUNTER — Ambulatory Visit: Payer: BLUE CROSS/BLUE SHIELD | Admitting: Physical Therapy

## 2016-07-22 DIAGNOSIS — M25561 Pain in right knee: Secondary | ICD-10-CM | POA: Diagnosis not present

## 2016-07-22 DIAGNOSIS — G8929 Other chronic pain: Secondary | ICD-10-CM | POA: Diagnosis not present

## 2016-07-22 NOTE — Therapy (Signed)
[facility] [address] [place], [place], [address] Phone: [phone]   Fax:  [phone]  Physical Therapy Treatment  Patient Details  Name: Glenn Wall MRN: [id number] Date of Birth: 01-02-1991 Referring Provider: Enid BaasKarl Fields, MD  Encounter Date: 07/22/2016      PT End of Session - 07/22/16 1420    Visit Number 15   Number of Visits 17   Date for PT Re-Evaluation 07/25/16   PT Start Time 1417   PT Stop Time 1502   PT Time Calculation (min) 45 min   Activity Tolerance Patient tolerated treatment well   Behavior During Therapy [facility] for tasks assessed/performed      Past Medical History:  Diagnosis Date  . Abdominal pain     Past Surgical History:  Procedure Laterality Date  . ARTHROSCOPIC REPAIR ACL  2011   right knee  . ESOPHAGOGASTRODUODENOSCOPY N/A 12/05/2015   Procedure: ESOPHAGOGASTRODUODENOSCOPY (EGD);  Surgeon: Malissa HippoNajeeb U Rehman, MD;  [facility];  Service: Endoscopy;  Laterality: N/A;  2:05 - moved to 5/19 @ 2:40 - Ann notified pt    There were no vitals filed for this visit.      Subjective Assessment - 07/22/16 1419    Subjective Knee is OK, Ive had a couple of times when my knee bothers me but its mild.     Currently in Pain? No/denies            [facility] Adult PT Treatment/Exercise - 07/22/16 1424      Knee/Hip Exercises: Aerobic   Stepper 5 min L 3      Knee/Hip Exercises: Machines for Strengthening   Other Machine Reformer see note       Pilates Reformer used for LE/core strength, postural strength, lumbopelvic disassociation and core control.  Exercises included:  Footwork double, heels and forefoot 4 springs , calf stretching  Bridging double leg add press 3 springs.  Single leg x 5 and add SLR   Supine Abs 1 Red 1 blue chest lift (hundred prep) knees bent and ext   Feet in Straps 1 Red 1 blue arcs and added magic circle for hip add x 6  Squats parallel x 10 and with  turnout (Frog Squats ) x 10   Reverse Abdominals 1 Red UE x 8 and LE x 8  Standing work 1 Red for abdominals added bent knee press out x 10 and skater x 10 and adduction 1 blue x 10   Min pain in Rt. Knee with bent knee/skater ("pressure")        PT Education - 07/22/16 1505    Education provided Yes   Education Details Pilates advanced work on Chiropractoreformer, group classes    Person(s) Educated Patient   Methods Explanation;Demonstration;Verbal cues   Comprehension Verbalized understanding;Returned demonstration          PT Short Term Goals - 07/08/16 2145      PT SHORT TERM GOAL #1   Title Pt will be able to descend stairs without knee pain by 12/29   Status On-going     PT SHORT TERM GOAL #2   Title Pt will be able to return to independent exercise program without limitation by knee pain   Baseline pain continues in single leg activites with traditional gym ex   Status On-going     PT SHORT TERM GOAL #3   Title FOTO to 76% ability to indicate significant increase in fucntional ability   Status Unable to  assess                  Plan - 07/22/16 1506    Clinical Impression Statement MIn pain with sidelunge motion (skater on Reformer) resolved when exercise stopped.  Overall did well with report of muscle fatigue of adductors with exercises.  Receptive to feedback and interested in Group Pilates exercise sessions which he can do for Free at work.     PT Next Visit Plan goals, FOTO/DC  LE alignment in single leg and squatting activities   PT Home Exercise Plan hamstring and quad stretch, figure 4 piriformis stretch, SLR with ER, leg press at gym; qped hip hinge, leg sweep, primal push up, SLR with pulse lifts   Consulted and Agree with Plan of Care Patient      Patient will benefit from skilled therapeutic intervention in order to improve the following deficits and impairments:  Pain, Improper body mechanics, Impaired flexibility, Decreased strength, Decreased  activity tolerance  Visit Diagnosis: Chronic pain of right knee     Problem List Patient Active Problem List   Diagnosis Date Noted  . Right knee pain 04/03/2016  . Abdominal pain 11/13/2015    PAA,JENNIFER 07/22/2016, 3:09 PM  [facility] [address] [place], [place], [address] Phone: [phone]   Fax:  [phone]  Name: Glenn Wall MRN: [id number] Date of Birth: [birth year]  Karie MainlandJennifer Paa, PT 07/22/16 3:09 PM Phone: [phone] Fax: [phone]

## 2016-07-24 ENCOUNTER — Encounter: Payer: Self-pay | Admitting: Physical Therapy

## 2016-07-24 ENCOUNTER — Ambulatory Visit: Payer: BLUE CROSS/BLUE SHIELD | Admitting: Physical Therapy

## 2016-07-24 DIAGNOSIS — G8929 Other chronic pain: Secondary | ICD-10-CM

## 2016-07-24 DIAGNOSIS — M25561 Pain in right knee: Secondary | ICD-10-CM | POA: Diagnosis not present

## 2016-07-24 NOTE — Therapy (Signed)
[place], [place], [address] Phone: [phone]   Fax:  [phone]  Physical Therapy Treatment  Patient Details  Name: Glenn Wall MRN: [id number] Date of Birth: 1991-05-03 Referring Provider: Stefanie Libel, MD  Encounter Date: 07/24/2016      PT End of Session - 07/24/16 1332    Visit Number 16   Number of Visits 17   Date for PT Re-Evaluation 07/25/16   Authorization Type BCBS 30 visit limit   PT Start Time 1332   PT Stop Time 1413   PT Time Calculation (min) 41 min   Activity Tolerance Patient tolerated treatment well   Behavior During Therapy Glenn Wall for tasks assessed/performed      Past Medical History:  Diagnosis Date  . Abdominal pain     Past Surgical History:  Procedure Laterality Date  . ARTHROSCOPIC REPAIR ACL  2011   right knee  . ESOPHAGOGASTRODUODENOSCOPY N/A 12/05/2015   Procedure: ESOPHAGOGASTRODUODENOSCOPY (EGD);  Surgeon: Glenn Houston, MD;  [facility];  Service: Endoscopy;  Laterality: N/A;  2:05 - moved to 5/19 @ 2:40 - Ann notified pt    There were no vitals filed for this visit.      Subjective Assessment - 07/24/16 1332    Subjective No pain in knee today. Has been able to return to gym without complications. Still has to be careful descending stairs.    Patient Stated Goals return to exercise, decrease pain.    Currently in Pain? No/denies            [name] PT Assessment - 07/24/16 0001      Observation/Other Assessments   Focus on Therapeutic Outcomes (FOTO)  70% ability     Sensation   Additional Comments WFL     Strength   Right Hip Flexion 5/5  illiopsoas 5/5   Left Hip Flexion 4+/5  illiopsoas 4/5   Right Knee Flexion 5/5   Right Knee Extension 5/5   Left Knee Flexion 5/5   Left Knee Extension 5/5     Palpation   Patella mobility patellar glide [place] [facility]                     [name] - 07/24/16  0001      Knee/Hip Exercises: Stretches   Passive Hamstring Stretch Limitations supine with strap   Quad Stretch Limitations standing   Piriformis Stretch Limitations figure 4   Other Knee/Hip Stretches downward dog     Knee/Hip Exercises: Aerobic   Stepper 5 min L5     Knee/Hip Exercises: Standing   Lateral Step Up Limitations knee drives 6" step, slow and fast   SLS golfer lift on airex 5lb kettle bell x10 ea   Other Standing Knee Exercises single leg squat with slider x10 ea     Knee/Hip Exercises: Prone   Straight Leg Raises Limitations Qped leg sweep                PT Education - 07/24/16 1350    Education provided Yes   Education Details exercise form/rationale, HEP, progress and continued exercise   Person(s) Educated Patient   Methods Explanation;Verbal cues   Comprehension Verbalized understanding          PT Short Term Goals - 07/24/16 1335      PT SHORT TERM GOAL #1   Title Pt will be able to descend stairs without knee pain by 12/29  Baseline no pain when careful   Status Partially Met     PT SHORT TERM GOAL #2   Title Pt will be able to return to independent exercise program without limitation by knee pain   Status Achieved     PT SHORT TERM GOAL #3   Title FOTO to 76% ability to indicate significant increase in fucntional ability   Baseline 70% ability at d/c   Status Not Met     PT SHORT TERM GOAL #4   Title Pt will be able to demonstrate an appropraite squat to and from a chair without knee pain    Status Achieved     PT SHORT TERM GOAL #5   Title Pt will demo single leg eccentric squat without pain in knee to descend stairs and return to PLOF   Status Achieved                  Plan - 07/24/16 1414    Clinical Impression Statement pt is d/c to independent program at this time. Pt verbalized comfort and understanding and was instructed to contact us wtih any further questions.    Consulted and Agree with Plan of Care  Patient      Patient will benefit from skilled therapeutic intervention in order to improve the following deficits and impairments:     Visit Diagnosis: Chronic pain of right knee     Problem List Patient Active Problem List   Diagnosis Date Noted  . Right knee pain 04/03/2016  . Abdominal pain 11/13/2015    Glenn Wall PT, [name] 07/24/16 2:15 PM   [facility] [address] [place], [place], [address] Phone: [phone]   Fax:  [phone]  Name: Glenn Wall MRN: [id number] Date of Birth: May 29, 1991

## 2016-07-29 ENCOUNTER — Ambulatory Visit: Payer: BLUE CROSS/BLUE SHIELD | Admitting: Physical Therapy

## 2016-07-31 ENCOUNTER — Ambulatory Visit: Payer: BLUE CROSS/BLUE SHIELD | Admitting: Physical Therapy

## 2016-09-02 ENCOUNTER — Encounter: Payer: BLUE CROSS/BLUE SHIELD | Admitting: Physical Therapy

## 2016-09-04 ENCOUNTER — Encounter: Payer: BLUE CROSS/BLUE SHIELD | Admitting: Physical Therapy

## 2016-11-07 IMAGING — US US ABDOMEN COMPLETE
1 series · 14 of 25 positions shown · non-contrast
Comparison: None.

CLINICAL DATA: Abdominal pain for 1 month

EXAM:
ABDOMEN ULTRASOUND COMPLETE

[Series 1: us abdomen complete · 0.16mm/px · 14 of 138 slices shown]
[im 1/138]
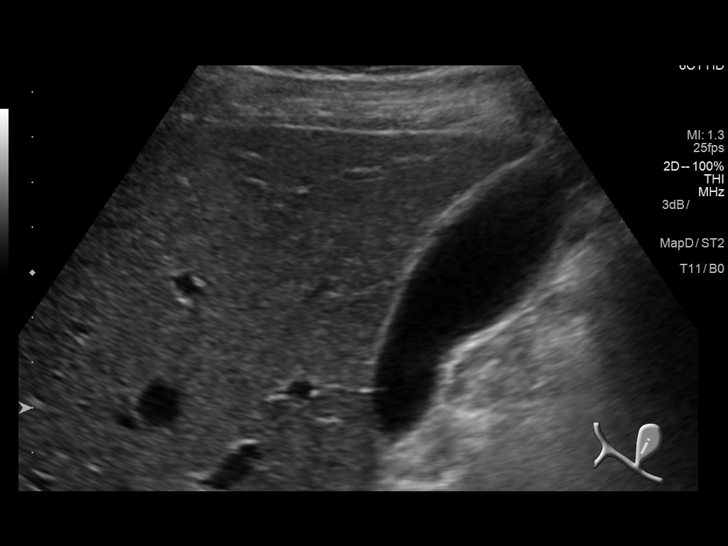
[im 12/138]
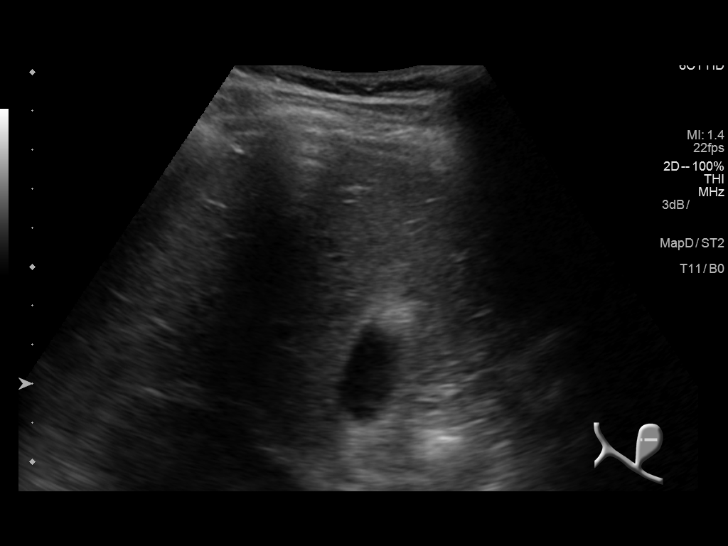
[im 23/138]
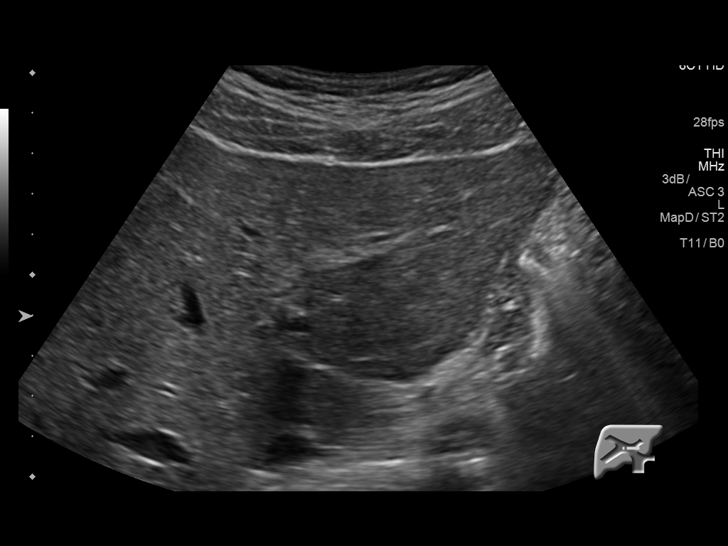
[im 35/138]
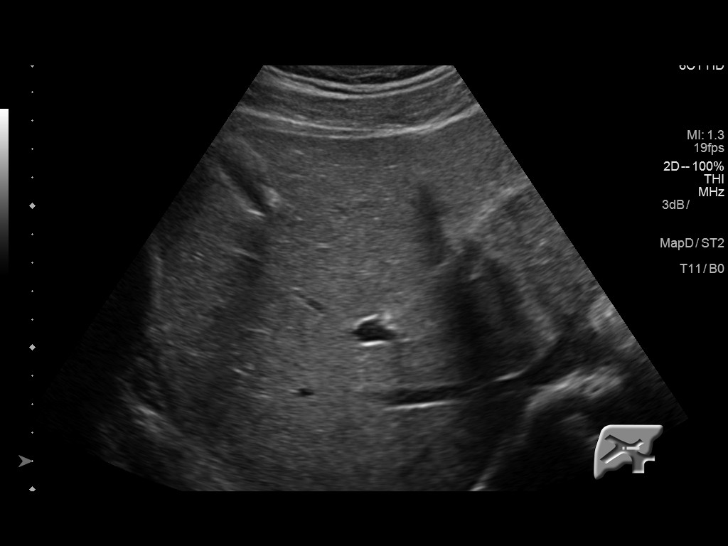
[im 46/138]
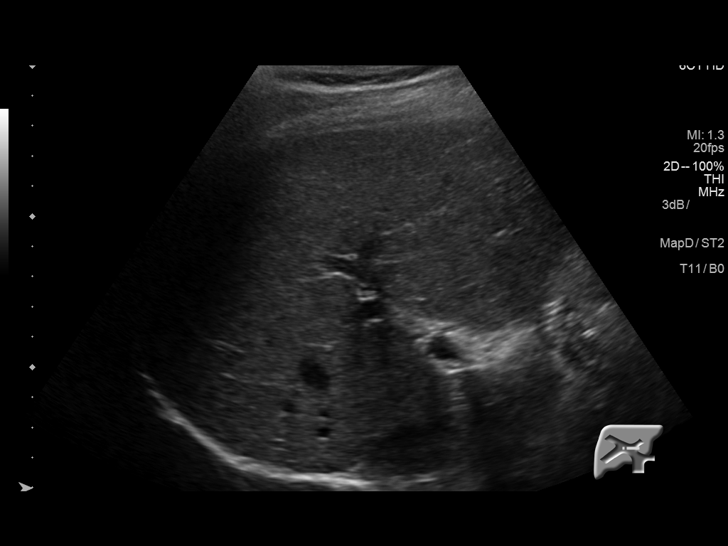
[im 52/138]
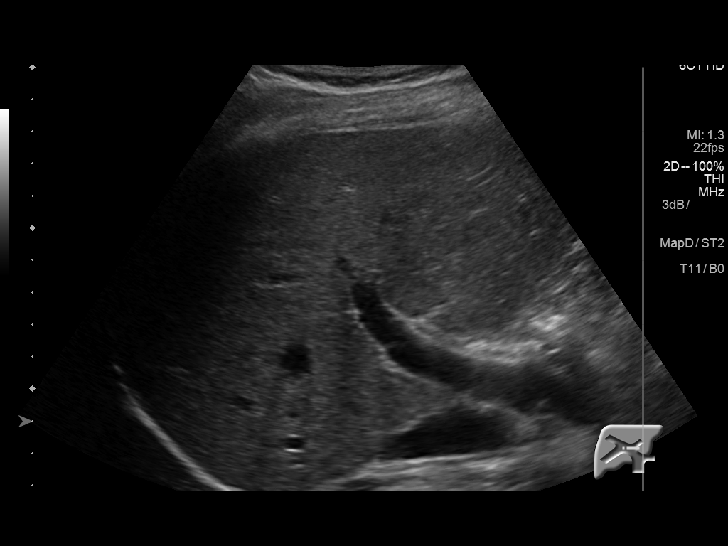
[im 63/138]
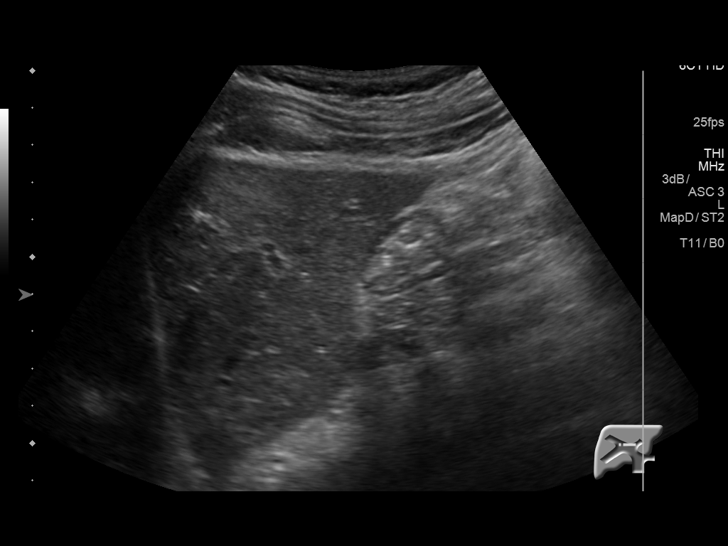
[im 75/138]
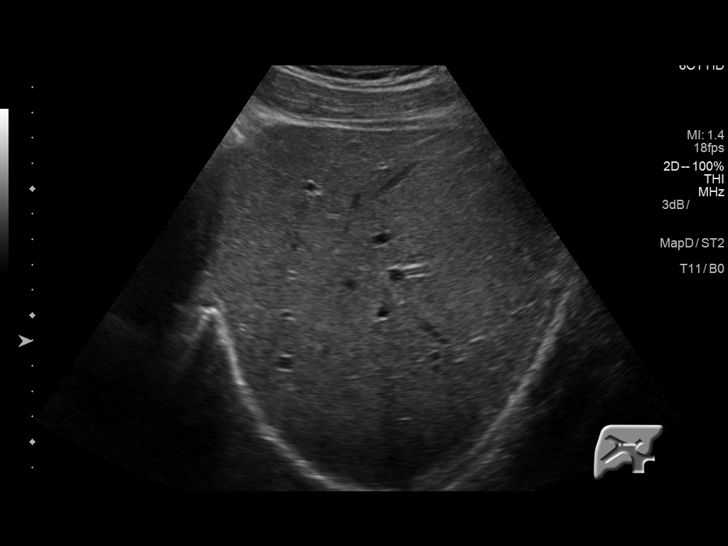
[im 86/138]
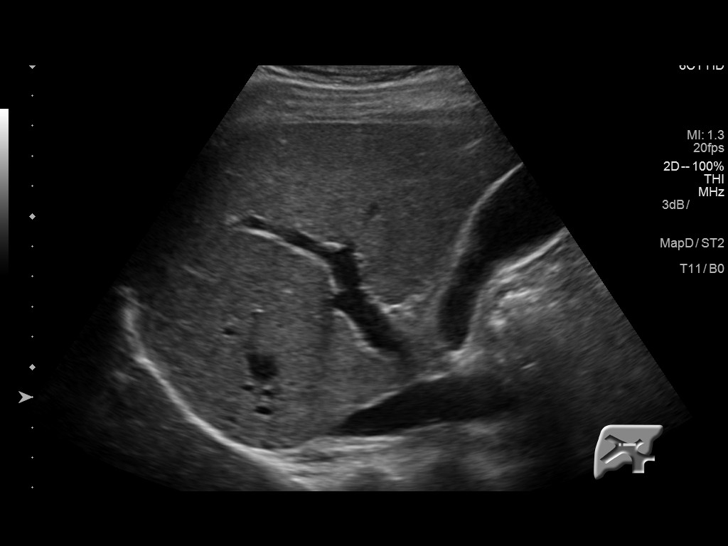
[im 92/138]
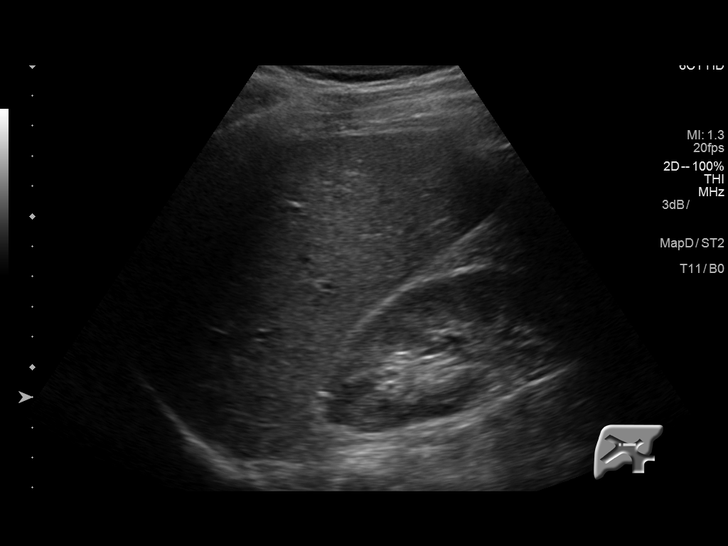
[im 103/138]
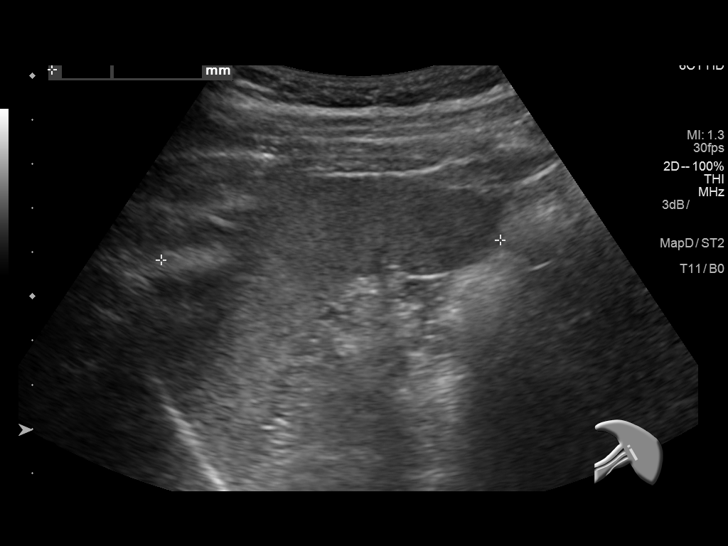
[im 115/138]
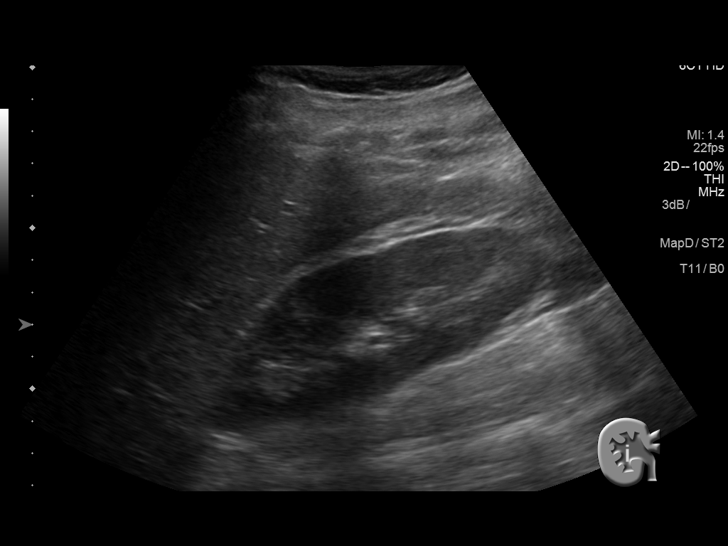
[im 126/138]
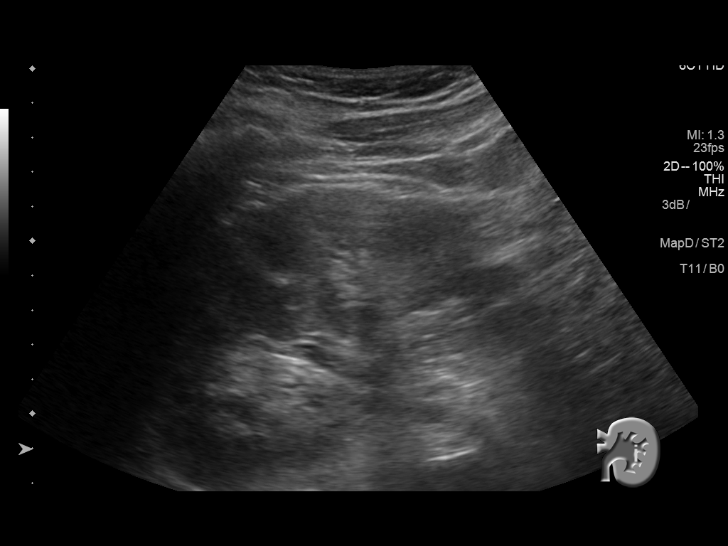
[im 138/138]
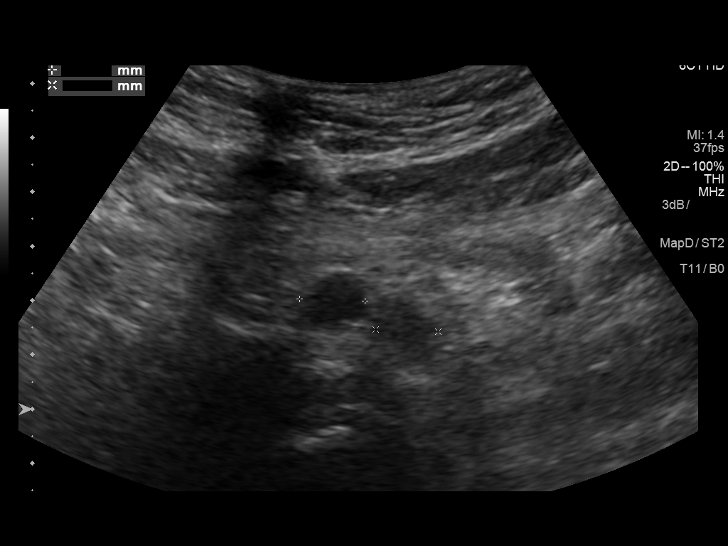

[14 of 25 positions shown; findings below may reference images not displayed]

FINDINGS: Gallbladder: The gallbladder is visualized and no gallstones are
noted. There is no pain over the gallbladder with compression.

Common bile duct: Diameter: The common bile duct is normal measuring
2.1 mm in diameter.

Liver: The liver has a normal echogenic pattern. No focal hepatic
abnormality is seen.

IVC: No abnormality is noted.

Pancreas: The pancreas is moderately well seen with only the most
distal tail obscured by bowel gas. The pancreatic duct is not
dilated.

Spleen: The spleen is normal measuring 7.7 cm.

Right Kidney: Length: 12.4 cm..  No hydronephrosis is seen.

Left Kidney: Length: 12.6 cm..  No hydronephrosis is noted.

Abdominal aorta: The abdominal aorta is normal in caliber.

Other findings: None.
IMPRESSION: Negative abdominal ultrasound.

## 2017-01-22 ENCOUNTER — Encounter: Payer: Self-pay | Admitting: Sports Medicine

## 2017-01-22 ENCOUNTER — Ambulatory Visit (INDEPENDENT_AMBULATORY_CARE_PROVIDER_SITE_OTHER): Payer: BLUE CROSS/BLUE SHIELD | Admitting: Sports Medicine

## 2017-01-22 DIAGNOSIS — M25561 Pain in right knee: Secondary | ICD-10-CM

## 2017-01-22 DIAGNOSIS — G8929 Other chronic pain: Secondary | ICD-10-CM | POA: Diagnosis not present

## 2017-01-22 MED ORDER — METHYLPREDNISOLONE ACETATE 40 MG/ML IJ SUSP
40.0000 mg | Freq: Once | INTRAMUSCULAR | Status: AC
  Administered 2017-01-22: 40 mg via INTRA_ARTICULAR

## 2017-01-22 NOTE — Progress Notes (Signed)
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 Palpation normal with no warmth or joint line tenderness or patellar tenderness or condyle tenderness. ROM normal in flexion and extension and lower leg rotation. Ligaments with solid consistent endpoints including ACL, PCL, LCL, MCL. Negative Mcmurray's and provocative meniscal tests. Non painful patellar compression. Patellar and quadriceps tendons unremarkable. Hamstring and quadriceps strength is normal.  Procedure:  Injection of RT knee Consent obtained and verified. Time-out conducted. Noted no overlying erythema, induration, or other signs of local infection. Skin prepped in a sterile fashion. Topical analgesic spray: Ethyl chloride. Completed without difficulty.  I sued US to identify the SPP and directed injection into the pouch without difficulty Meds: 1 cc soumedrol 40 and 3 cc lidocaine 1% Pain immediately improved suggesting accurate placement of the medication. Advised to call if fevers/chills, erythema, induration, drainage, or persistent bleeding.

## 2017-01-22 NOTE — Assessment & Plan Note (Signed)
WE will try CSI to see if he gets pain relief  I suggested not using injection more thatn twice yearly at his age  Cont HEP as given by PT

## 2017-02-24 DIAGNOSIS — Z23 Encounter for immunization: Secondary | ICD-10-CM | POA: Diagnosis not present

## 2017-02-24 DIAGNOSIS — R220 Localized swelling, mass and lump, head: Secondary | ICD-10-CM | POA: Diagnosis not present

## 2017-02-24 DIAGNOSIS — Z1389 Encounter for screening for other disorder: Secondary | ICD-10-CM | POA: Diagnosis not present

## 2017-02-24 DIAGNOSIS — Z6823 Body mass index (BMI) 23.0-23.9, adult: Secondary | ICD-10-CM | POA: Diagnosis not present

## 2017-06-26 IMAGING — CT CT ABDOMEN W/ CM
2 of 4 series · 16 of 46 positions shown, 18 images · IV contrast (iopamidol)
Comparison: None.

CLINICAL DATA: Patient with epigastric and periumbilical abdominal
pain for 2 months.

EXAM:
CT ABDOMEN WITH CONTRAST
TECHNIQUE: Multidetector CT imaging of the abdomen was performed using the
standard protocol following bolus administration of intravenous
contrast.
CONTRAST:  100mL RYGZGI-DPP IOPAMIDOL (RYGZGI-DPP) INJECTION 61%

[Series 2: abd_with 5.0 b40f · axial · 0.68mm/px · z∈[-269,-44]mm · 13 of 51 slices shown, 15 images]
[im 3/51  soft-tissue]
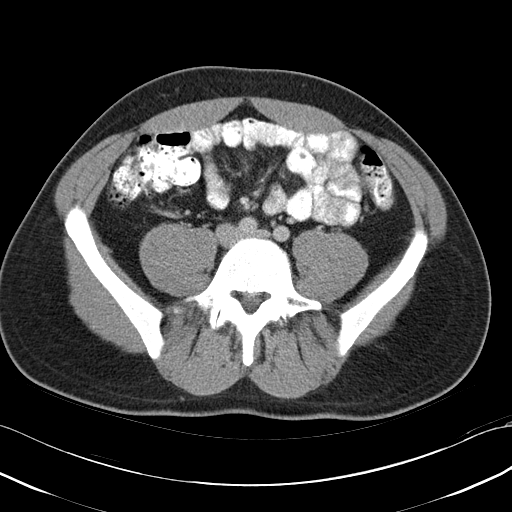
[im 3/51  bone]
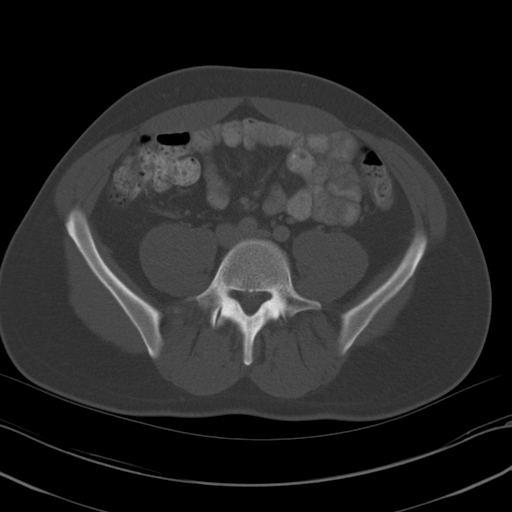
[im 8/51  soft-tissue]
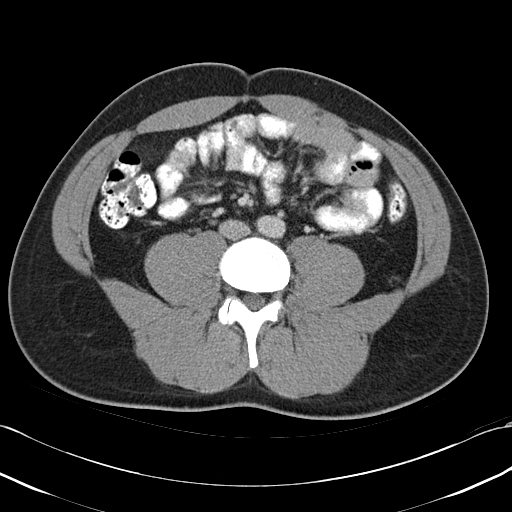
[im 10/51  soft-tissue]
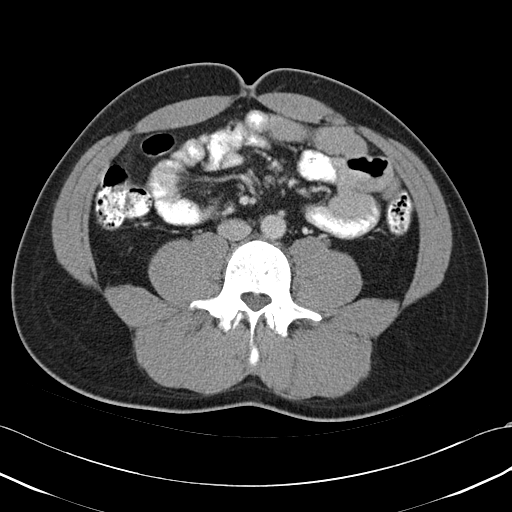
[im 15/51  soft-tissue]
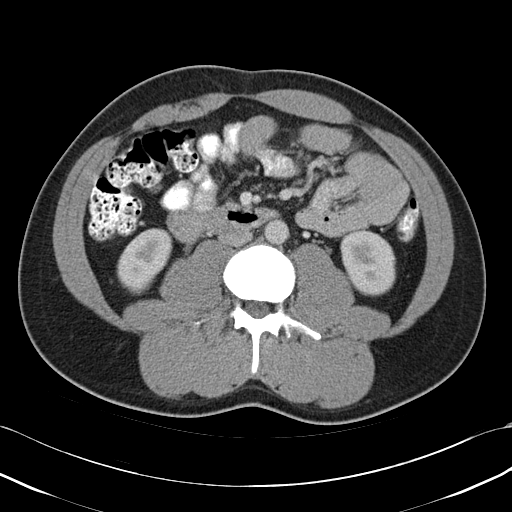
[im 17/51  soft-tissue]
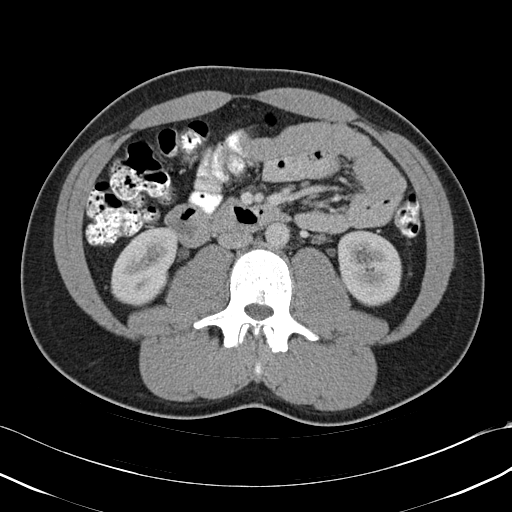
[im 22/51  soft-tissue]
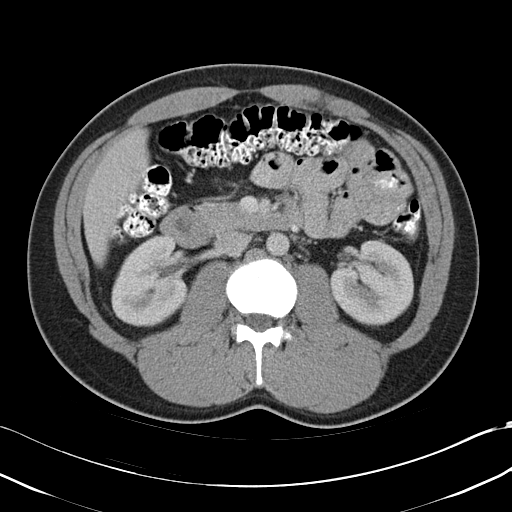
[im 27/51  soft-tissue]
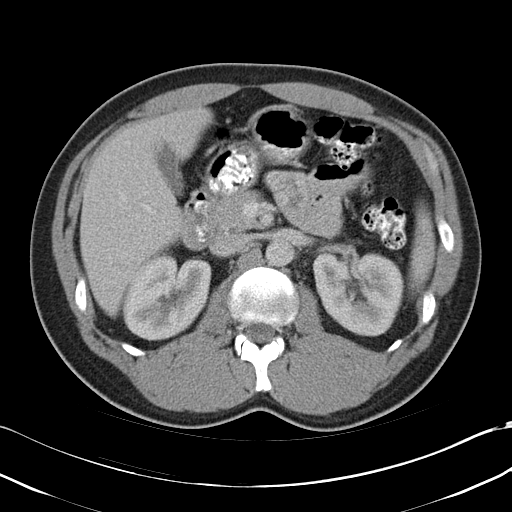
[im 29/51  soft-tissue]
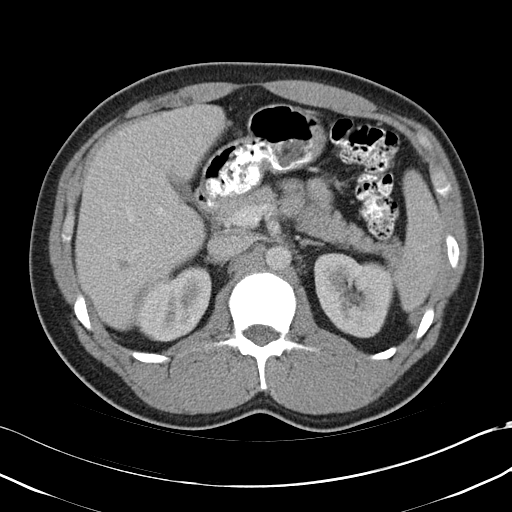
[im 34/51  soft-tissue]
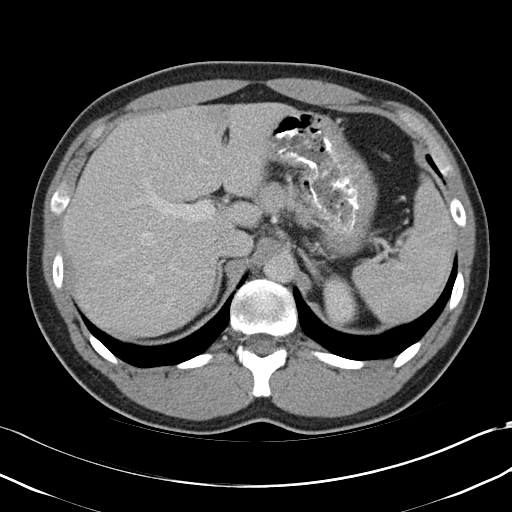
[im 34/51  bone]
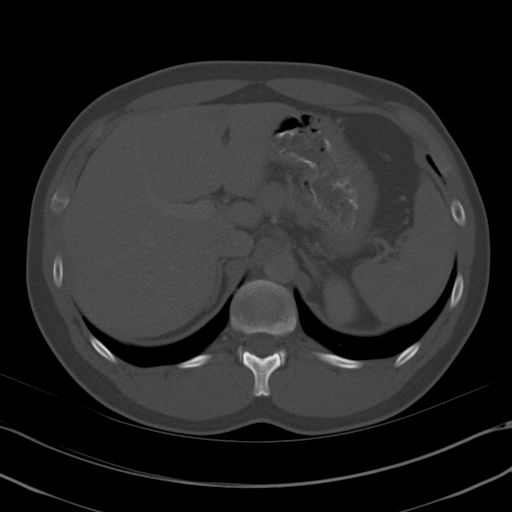
[im 36/51  soft-tissue]
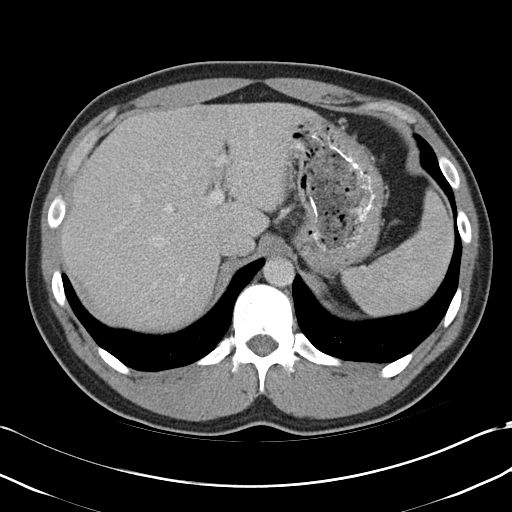
[im 41/51  soft-tissue]
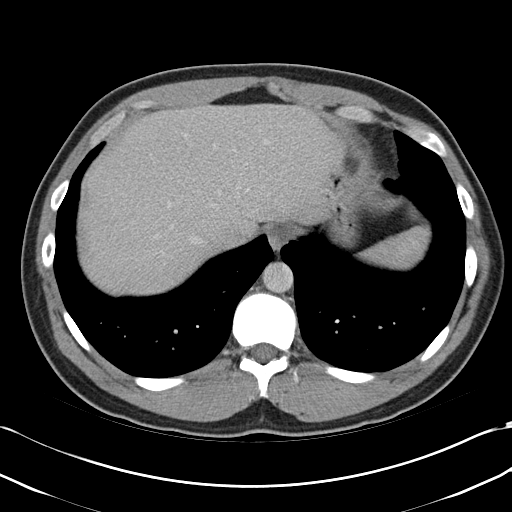
[im 43/51  soft-tissue]
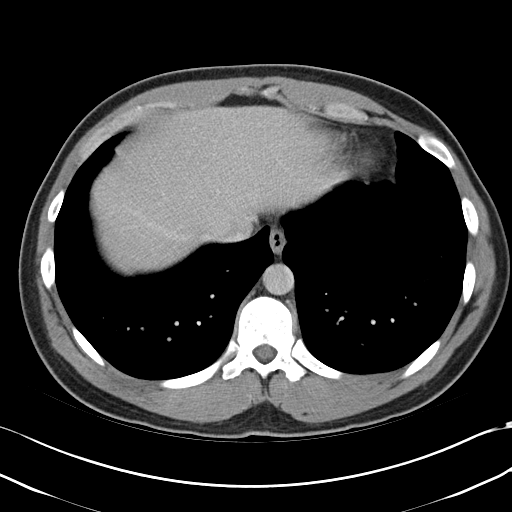
[im 48/51  soft-tissue]
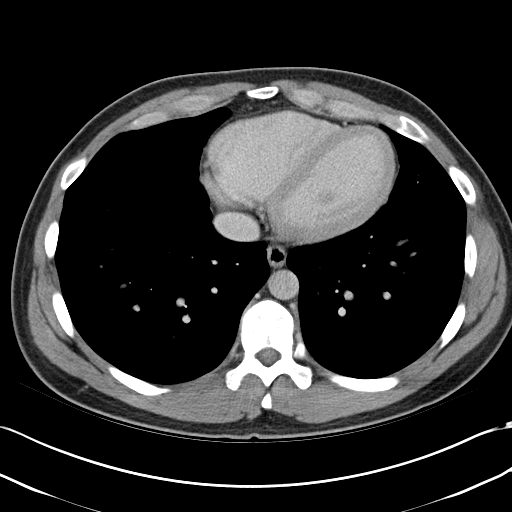

[Series 4: abd_with 3.0 spo cor cor · coronal · 0.52mm/px · 3 of 81 slices shown]
[im 27/81  soft-tissue]
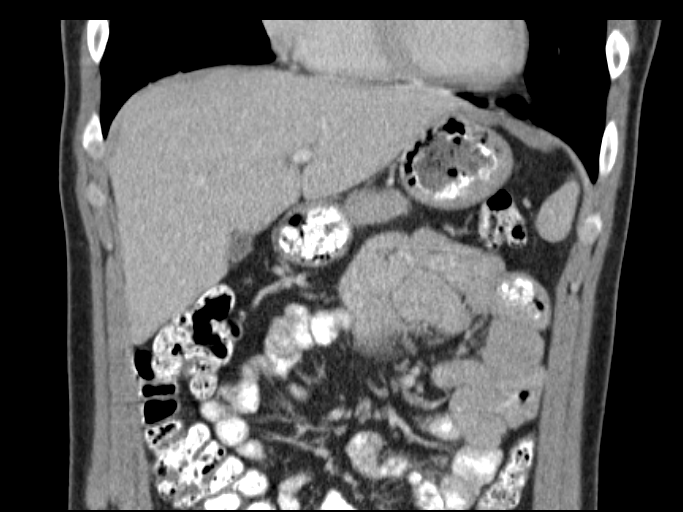
[im 36/81  soft-tissue]
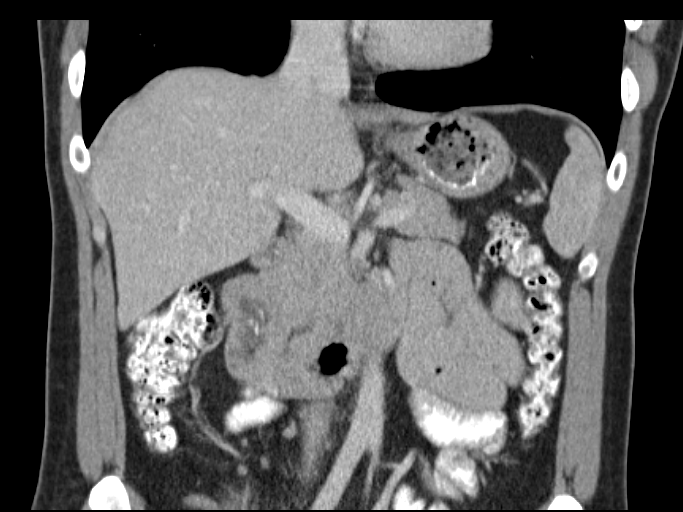
[im 45/81  soft-tissue]
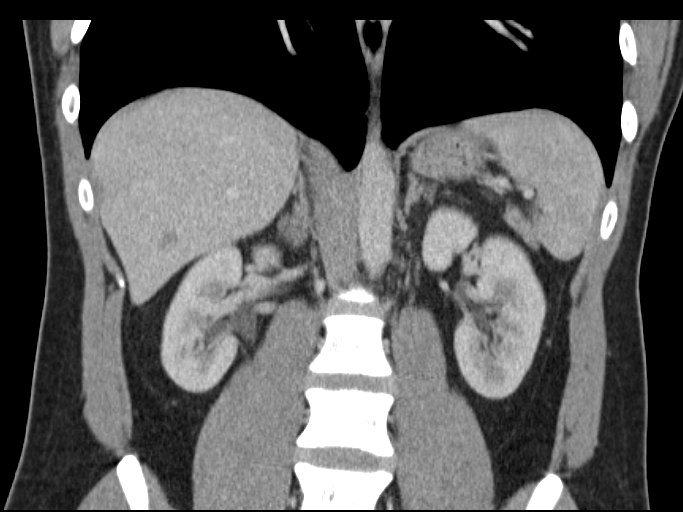

[16 of 46 positions shown; findings below may reference images not displayed]

FINDINGS: Lower chest: Heart is normal in size. No consolidative or nodular
pulmonary opacities. No pleural effusion.

Hepatobiliary: There is a 10 mm low-attenuation lesion within the
right hepatic lobe (image 24; series 2), indeterminate. Fatty
deposition adjacent to the falciform ligament. Gallbladder is
decompressed.

Pancreas: Unremarkable

Spleen: Unremarkable

Adrenals/Urinary Tract: Normal adrenal glands. Kidneys enhance
symmetrically with contrast. No hydronephrosis.

Stomach/Bowel: No abnormal bowel wall thickening or evidence for
bowel obstruction. No free fluid or free intraperitoneal air.

Vascular/Lymphatic: Normal caliber abdominal aorta. No
retroperitoneal lymphadenopathy.

Other: None.

Musculoskeletal: No aggressive or acute appearing osseous lesions.
IMPRESSION: No acute process within the abdomen.

10 mm low-attenuation lesion within the right hepatic lobe likely
benign in etiology, potentially representing a cyst.

## 2017-11-29 IMAGING — CR DG KNEE AP/LAT W/ SUNRISE*R*
1 series · 1 of 1 positions shown · non-contrast
Comparison: None.

CLINICAL DATA: Hx of old ACL injury to th left knee , recent pain
at the patella x 5 months with no new injury

EXAM:
RIGHT KNEE 3 VIEWS

[view not recorded]
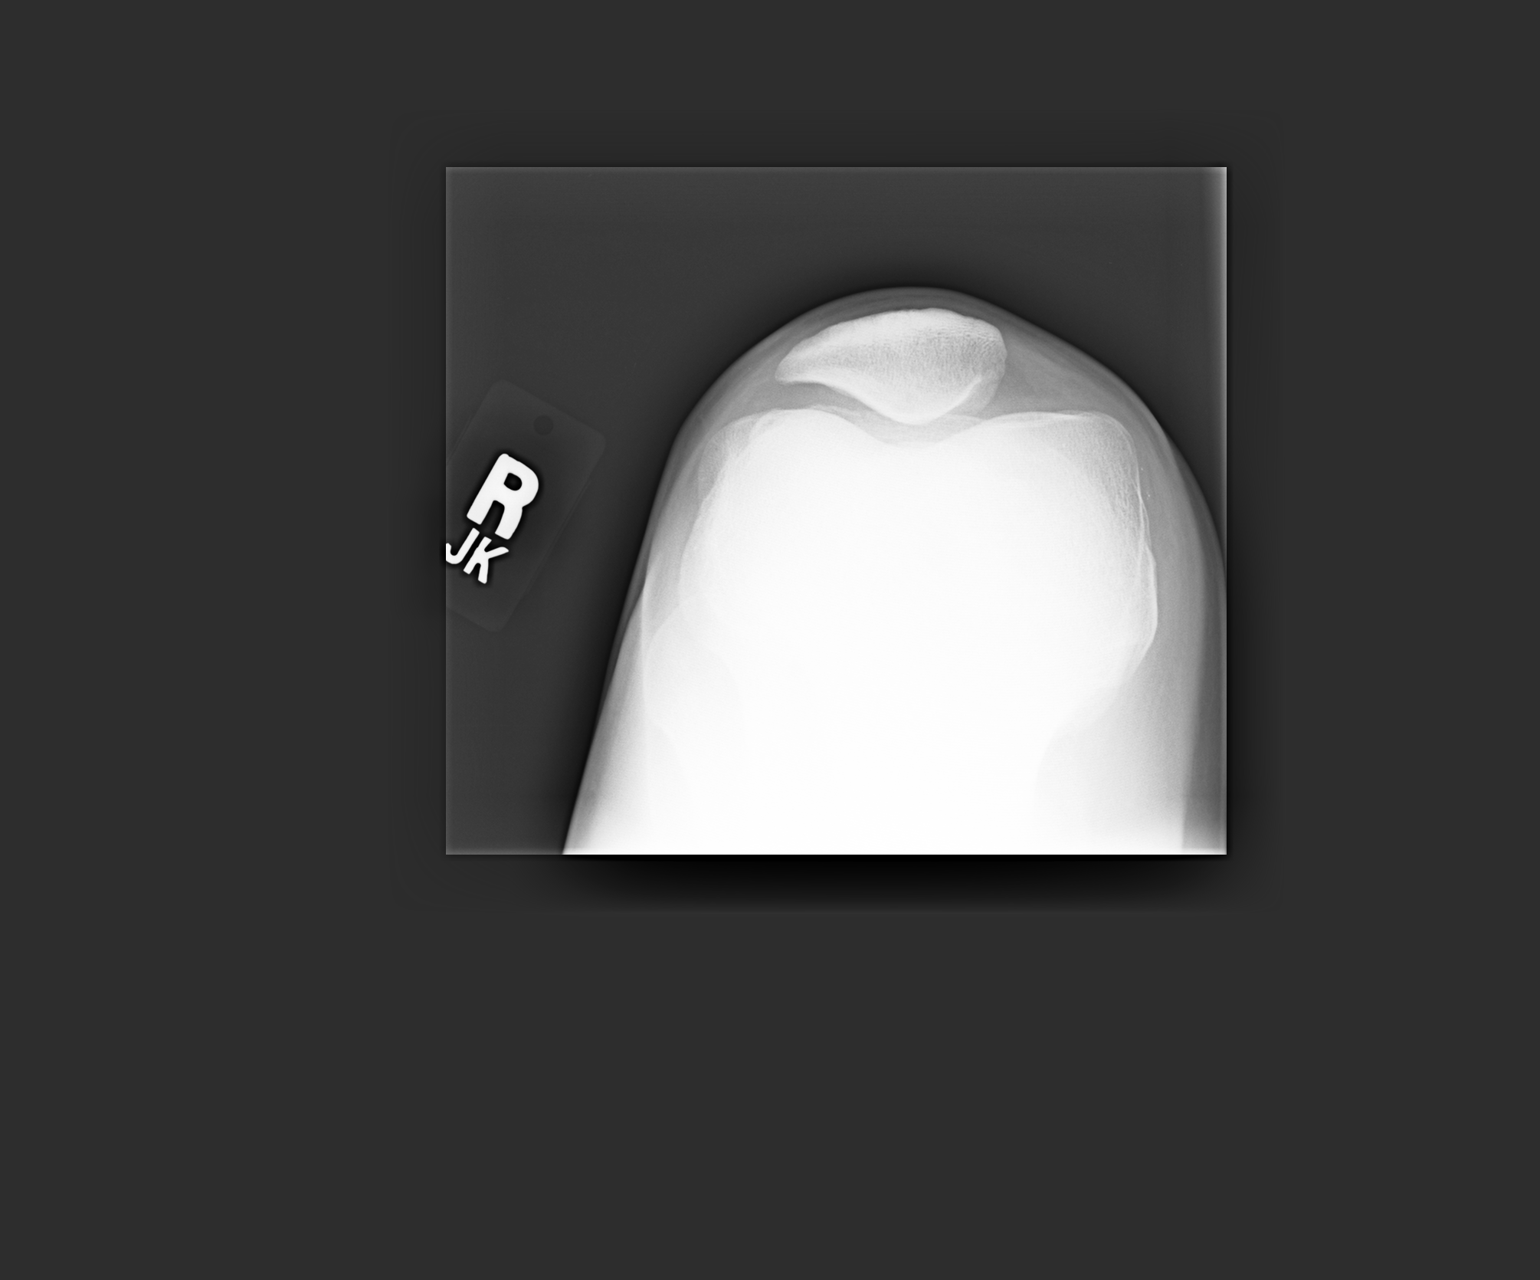

[1 of 1 positions shown; findings below may reference images not displayed]

FINDINGS: Anchors are identified consistent previous ACL repair. Trace joint
effusion is present. There is no acute fracture or subluxation.
IMPRESSION: Postoperative changes.  No evidence for acute  abnormality.

## 2018-07-23 DIAGNOSIS — Z6825 Body mass index (BMI) 25.0-25.9, adult: Secondary | ICD-10-CM | POA: Diagnosis not present

## 2018-07-23 DIAGNOSIS — Z1389 Encounter for screening for other disorder: Secondary | ICD-10-CM | POA: Diagnosis not present

## 2018-07-23 DIAGNOSIS — Z0000 Encounter for general adult medical examination without abnormal findings: Secondary | ICD-10-CM | POA: Diagnosis not present

## 2018-07-23 DIAGNOSIS — R001 Bradycardia, unspecified: Secondary | ICD-10-CM | POA: Diagnosis not present

## 2018-07-23 DIAGNOSIS — E663 Overweight: Secondary | ICD-10-CM | POA: Diagnosis not present
